# Patient Record
Sex: Female | Born: 1955 | Race: White | Hispanic: No | Smoking: Former smoker
Health system: Southern US, Community
[De-identification: ages and names within clinical notes are randomized; demographics above are authoritative.]

## PROBLEM LIST (undated history)

## (undated) DIAGNOSIS — G8929 Other chronic pain: Secondary | ICD-10-CM

## (undated) DIAGNOSIS — M545 Low back pain, unspecified: Secondary | ICD-10-CM

## (undated) DIAGNOSIS — R7989 Other specified abnormal findings of blood chemistry: Secondary | ICD-10-CM

## (undated) DIAGNOSIS — R945 Abnormal results of liver function studies: Secondary | ICD-10-CM

## (undated) DIAGNOSIS — K219 Gastro-esophageal reflux disease without esophagitis: Secondary | ICD-10-CM

## (undated) DIAGNOSIS — J069 Acute upper respiratory infection, unspecified: Secondary | ICD-10-CM

## (undated) DIAGNOSIS — H9319 Tinnitus, unspecified ear: Secondary | ICD-10-CM

## (undated) DIAGNOSIS — E539 Vitamin B deficiency, unspecified: Secondary | ICD-10-CM

## (undated) DIAGNOSIS — I10 Essential (primary) hypertension: Secondary | ICD-10-CM

## (undated) DIAGNOSIS — D497 Neoplasm of unspecified behavior of endocrine glands and other parts of nervous system: Secondary | ICD-10-CM

## (undated) DIAGNOSIS — J449 Chronic obstructive pulmonary disease, unspecified: Secondary | ICD-10-CM

## (undated) DIAGNOSIS — H9190 Unspecified hearing loss, unspecified ear: Secondary | ICD-10-CM

## (undated) DIAGNOSIS — E049 Nontoxic goiter, unspecified: Secondary | ICD-10-CM

## (undated) HISTORY — DX: Vitamin B deficiency, unspecified: E53.9

## (undated) HISTORY — DX: Acute upper respiratory infection, unspecified: J06.9

## (undated) HISTORY — DX: Low back pain, unspecified: M54.50

## (undated) HISTORY — PX: BACK SURGERY: SHX140

## (undated) HISTORY — DX: Other specified abnormal findings of blood chemistry: R79.89

## (undated) HISTORY — DX: Neoplasm of unspecified behavior of endocrine glands and other parts of nervous system: D49.7

## (undated) HISTORY — DX: Nontoxic goiter, unspecified: E04.9

## (undated) HISTORY — DX: Tinnitus, unspecified ear: H93.19

## (undated) HISTORY — DX: Low back pain: M54.5

## (undated) HISTORY — DX: Gastro-esophageal reflux disease without esophagitis: K21.9

## (undated) HISTORY — PX: WISDOM TOOTH EXTRACTION: SHX21

## (undated) HISTORY — DX: Abnormal results of liver function studies: R94.5

## (undated) HISTORY — DX: Unspecified hearing loss, unspecified ear: H91.90

## (undated) HISTORY — DX: Other chronic pain: G89.29

---

## 1988-12-11 HISTORY — PX: DILATION AND CURETTAGE OF UTERUS: SHX78

## 1999-03-01 ENCOUNTER — Encounter: Payer: Self-pay | Admitting: Internal Medicine

## 1999-03-01 ENCOUNTER — Ambulatory Visit (HOSPITAL_COMMUNITY): Admission: RE | Admit: 1999-03-01 | Discharge: 1999-03-01 | Payer: Self-pay | Admitting: Internal Medicine

## 1999-05-04 ENCOUNTER — Other Ambulatory Visit: Admission: RE | Admit: 1999-05-04 | Discharge: 1999-05-04 | Payer: Self-pay | Admitting: *Deleted

## 1999-11-15 ENCOUNTER — Ambulatory Visit (HOSPITAL_COMMUNITY): Admission: RE | Admit: 1999-11-15 | Discharge: 1999-11-15 | Payer: Self-pay | Admitting: *Deleted

## 1999-11-15 ENCOUNTER — Encounter: Payer: Self-pay | Admitting: *Deleted

## 2000-05-01 ENCOUNTER — Encounter: Payer: Self-pay | Admitting: Internal Medicine

## 2000-05-01 ENCOUNTER — Encounter: Admission: RE | Admit: 2000-05-01 | Discharge: 2000-05-01 | Payer: Self-pay | Admitting: Internal Medicine

## 2000-05-09 ENCOUNTER — Other Ambulatory Visit: Admission: RE | Admit: 2000-05-09 | Discharge: 2000-05-09 | Payer: Self-pay | Admitting: *Deleted

## 2001-08-15 ENCOUNTER — Other Ambulatory Visit: Admission: RE | Admit: 2001-08-15 | Discharge: 2001-08-15 | Payer: Self-pay | Admitting: *Deleted

## 2002-08-13 ENCOUNTER — Other Ambulatory Visit: Admission: RE | Admit: 2002-08-13 | Discharge: 2002-08-13 | Payer: Self-pay | Admitting: *Deleted

## 2003-09-25 ENCOUNTER — Other Ambulatory Visit: Admission: RE | Admit: 2003-09-25 | Discharge: 2003-09-25 | Payer: Self-pay | Admitting: *Deleted

## 2005-01-16 ENCOUNTER — Other Ambulatory Visit: Admission: RE | Admit: 2005-01-16 | Discharge: 2005-01-16 | Payer: Self-pay | Admitting: Obstetrics and Gynecology

## 2005-01-20 ENCOUNTER — Ambulatory Visit (HOSPITAL_COMMUNITY): Admission: RE | Admit: 2005-01-20 | Discharge: 2005-01-20 | Payer: Self-pay | Admitting: Obstetrics and Gynecology

## 2005-03-07 ENCOUNTER — Other Ambulatory Visit: Admission: RE | Admit: 2005-03-07 | Discharge: 2005-03-07 | Payer: Self-pay | Admitting: Interventional Radiology

## 2005-03-07 ENCOUNTER — Encounter (INDEPENDENT_AMBULATORY_CARE_PROVIDER_SITE_OTHER): Payer: Self-pay | Admitting: *Deleted

## 2005-03-07 ENCOUNTER — Encounter: Admission: RE | Admit: 2005-03-07 | Discharge: 2005-03-07 | Payer: Self-pay | Admitting: Obstetrics and Gynecology

## 2005-03-22 ENCOUNTER — Emergency Department (HOSPITAL_COMMUNITY): Admission: EM | Admit: 2005-03-22 | Discharge: 2005-03-22 | Payer: Self-pay | Admitting: Emergency Medicine

## 2005-04-15 ENCOUNTER — Ambulatory Visit (HOSPITAL_COMMUNITY): Admission: RE | Admit: 2005-04-15 | Discharge: 2005-04-15 | Payer: Self-pay | Admitting: Internal Medicine

## 2005-06-15 ENCOUNTER — Ambulatory Visit (HOSPITAL_COMMUNITY): Admission: RE | Admit: 2005-06-15 | Discharge: 2005-06-16 | Payer: Self-pay | Admitting: Neurosurgery

## 2005-08-10 ENCOUNTER — Encounter: Admission: RE | Admit: 2005-08-10 | Discharge: 2005-10-11 | Payer: Self-pay | Admitting: Neurosurgery

## 2006-03-19 ENCOUNTER — Encounter: Admission: RE | Admit: 2006-03-19 | Discharge: 2006-03-19 | Payer: Self-pay | Admitting: Internal Medicine

## 2007-01-30 ENCOUNTER — Ambulatory Visit: Payer: Self-pay | Admitting: Cardiology

## 2007-02-25 ENCOUNTER — Encounter: Payer: Self-pay | Admitting: Cardiology

## 2007-02-25 ENCOUNTER — Ambulatory Visit: Payer: Self-pay

## 2007-02-25 ENCOUNTER — Ambulatory Visit: Payer: Self-pay | Admitting: Cardiology

## 2007-02-25 LAB — CONVERTED CEMR LAB
BUN: 22 mg/dL (ref 6–23)
Bilirubin, Direct: 0.1 mg/dL (ref 0.0–0.3)
Chloride: 106 meq/L (ref 96–112)
Creatinine, Ser: 0.4 mg/dL (ref 0.4–1.2)
GFR calc non Af Amer: 179 mL/min
LDL Cholesterol: 78 mg/dL (ref 0–99)
Sodium: 140 meq/L (ref 135–145)
TSH: 1.2 microintl units/mL (ref 0.35–5.50)
Total Bilirubin: 0.9 mg/dL (ref 0.3–1.2)
Total CHOL/HDL Ratio: 3.1
Triglycerides: 194 mg/dL — ABNORMAL HIGH (ref 0–149)
VLDL: 39 mg/dL (ref 0–40)

## 2007-02-28 ENCOUNTER — Ambulatory Visit: Payer: Self-pay | Admitting: Cardiology

## 2007-03-08 ENCOUNTER — Ambulatory Visit: Payer: Self-pay | Admitting: Cardiology

## 2007-03-08 LAB — CONVERTED CEMR LAB
BUN: 17 mg/dL (ref 6–23)
CO2: 31 meq/L (ref 19–32)
Calcium: 9.4 mg/dL (ref 8.4–10.5)
Glucose, Bld: 89 mg/dL (ref 70–99)
Sodium: 142 meq/L (ref 135–145)

## 2007-03-20 ENCOUNTER — Ambulatory Visit: Payer: Self-pay | Admitting: Cardiology

## 2007-04-30 ENCOUNTER — Ambulatory Visit: Payer: Self-pay | Admitting: Internal Medicine

## 2007-04-30 LAB — CONVERTED CEMR LAB
ANA Titer 1: 1:80 {titer} — ABNORMAL HIGH
Anti Nuclear Antibody(ANA): POSITIVE — AB
Hepatitis B Surface Ag: NEGATIVE
Iron: 128 ug/dL (ref 42–145)
Transferrin: 325.2 mg/dL (ref >=212.0)

## 2007-05-02 ENCOUNTER — Ambulatory Visit: Payer: Self-pay | Admitting: Internal Medicine

## 2007-05-29 ENCOUNTER — Ambulatory Visit: Payer: Self-pay | Admitting: Internal Medicine

## 2007-07-09 ENCOUNTER — Encounter: Admission: RE | Admit: 2007-07-09 | Discharge: 2007-10-07 | Payer: Self-pay | Admitting: Internal Medicine

## 2007-12-03 ENCOUNTER — Ambulatory Visit: Payer: Self-pay | Admitting: Internal Medicine

## 2007-12-17 ENCOUNTER — Ambulatory Visit: Payer: Self-pay | Admitting: Internal Medicine

## 2008-03-11 ENCOUNTER — Ambulatory Visit (HOSPITAL_COMMUNITY): Admission: RE | Admit: 2008-03-11 | Discharge: 2008-03-11 | Payer: Self-pay | Admitting: *Deleted

## 2008-04-11 DIAGNOSIS — K7689 Other specified diseases of liver: Secondary | ICD-10-CM | POA: Insufficient documentation

## 2008-04-11 DIAGNOSIS — K219 Gastro-esophageal reflux disease without esophagitis: Secondary | ICD-10-CM | POA: Insufficient documentation

## 2008-04-11 DIAGNOSIS — J309 Allergic rhinitis, unspecified: Secondary | ICD-10-CM | POA: Insufficient documentation

## 2008-04-11 DIAGNOSIS — E039 Hypothyroidism, unspecified: Secondary | ICD-10-CM | POA: Insufficient documentation

## 2008-04-11 DIAGNOSIS — M129 Arthropathy, unspecified: Secondary | ICD-10-CM | POA: Insufficient documentation

## 2008-04-11 DIAGNOSIS — I10 Essential (primary) hypertension: Secondary | ICD-10-CM | POA: Insufficient documentation

## 2008-04-11 DIAGNOSIS — F341 Dysthymic disorder: Secondary | ICD-10-CM | POA: Insufficient documentation

## 2009-06-01 ENCOUNTER — Encounter: Payer: Self-pay | Admitting: Internal Medicine

## 2009-07-05 ENCOUNTER — Encounter: Admission: RE | Admit: 2009-07-05 | Discharge: 2009-07-05 | Payer: Self-pay | Admitting: Otolaryngology

## 2009-09-27 ENCOUNTER — Encounter: Payer: Self-pay | Admitting: Internal Medicine

## 2009-12-24 ENCOUNTER — Encounter: Payer: Self-pay | Admitting: Internal Medicine

## 2009-12-29 ENCOUNTER — Encounter: Admission: RE | Admit: 2009-12-29 | Discharge: 2009-12-29 | Payer: Self-pay | Admitting: Internal Medicine

## 2010-06-22 ENCOUNTER — Encounter: Payer: Self-pay | Admitting: Internal Medicine

## 2010-06-30 ENCOUNTER — Encounter (INDEPENDENT_AMBULATORY_CARE_PROVIDER_SITE_OTHER): Payer: Self-pay | Admitting: *Deleted

## 2010-08-18 ENCOUNTER — Encounter: Payer: Self-pay | Admitting: Internal Medicine

## 2010-08-22 ENCOUNTER — Ambulatory Visit: Payer: Self-pay | Admitting: Internal Medicine

## 2010-10-21 ENCOUNTER — Encounter: Admission: RE | Admit: 2010-10-21 | Discharge: 2010-10-21 | Payer: Self-pay | Admitting: Gastroenterology

## 2010-12-20 ENCOUNTER — Encounter
Admission: RE | Admit: 2010-12-20 | Discharge: 2010-12-20 | Payer: Self-pay | Source: Home / Self Care | Attending: Internal Medicine | Admitting: Internal Medicine

## 2011-01-01 ENCOUNTER — Encounter: Payer: Self-pay | Admitting: *Deleted

## 2011-01-12 NOTE — Miscellaneous (Signed)
Summary: medication list  Clinical Lists Changes  Medications: Added new medication of GLUCOSAMINE 500 MG CAPS (GLUCOSAMINE SULFATE) 2 by mouth once daily Added new medication of MULTIVITAMINS  TABS (MULTIPLE VITAMIN) 1 by mouth once daily Added new medication of ZYRTEC ALLERGY 10 MG CAPS (CETIRIZINE HCL) take 1 by mouth once daily as needed allergy Added new medication of CALCIUM 500 MG TABS (CALCIUM) 1 by mouth two times a day Added new medication of TRAMADOL HCL 50 MG TABS (TRAMADOL HCL) 1-2 by mouth three times a day as needed pain Added new medication of VITAMIN B-12 1000 MCG SUBL (CYANOCOBALAMIN) 1 under tongue once daily Added new medication of VITAMIN D3 1000 UNIT CAPS (CHOLECALCIFEROL) 2 caps by mouth once daily Added new medication of LOSARTAN POTASSIUM 50 MG TABS (LOSARTAN POTASSIUM) 1 by mouth once daily

## 2011-01-12 NOTE — Letter (Signed)
Summary: New Patient letter  [facility]  [address] [place], [place] [address]   Phone: [phone]  Fax: [phone]       06/30/2010 MRN: [id number]  [facility] [name] [name] [address] [place], [place]  [address]  Dear [name] [name],  Welcome to the Gastroenterology Division at [facility].    You are scheduled to see Dr. Marina Goodell on 08/22/2010 at 10:00AM on the 3rd floor at [facility], [address]  We ask that you try to arrive at our office 15 minutes prior to your appointment time to allow for check-in.  We would like you to complete the enclosed self-administered evaluation form prior to your visit and bring it with you on the day of your appointment.  We will review it with you.  Also, please bring a complete list of all your medications or, if you prefer, bring the medication bottles and we will list them.  Please bring your insurance card so that we may make a copy of it.  If your insurance requires a referral to see a specialist, please bring your referral form from your primary care physician.  Co-payments are due at the time of your visit and may be paid by cash, check or credit card.     Your office visit will consist of a consult with your physician (includes a physical exam), any laboratory testing he/she may order, scheduling of any necessary diagnostic testing (e.g. x-ray, ultrasound, CT-scan), and scheduling of a procedure (e.g. Endoscopy, Colonoscopy) if required.  Please allow enough time on your schedule to allow for any/all of these possibilities.    If you cannot keep your appointment, please call [phone] to cancel or reschedule prior to your appointment date.  This allows [place] the opportunity to schedule an appointment for another patient in need of care.  If you do not cancel or reschedule by 5 p.m. the business day prior to your appointment date, you will be charged a $50.00 late cancellation/no-show fee.    Thank you for  choosing [facility] for your medical needs.  We appreciate the opportunity to care for you.  Please visit [place] at our website  to learn more about our practice.                     Sincerely,                                                             The Gastroenterology Division

## 2011-01-12 NOTE — Letter (Signed)
Summary: [facility]   Imported By: Sherian Rein 12/26/2010 12:02:28  _____________________________________________________________________  External Attachment:    Type:   Image     Comment:   External Document

## 2011-01-12 NOTE — Assessment & Plan Note (Signed)
Summary: elevated liver enzymes...as.    History of Present Illness Visit Type: Initial Consult Primary GI MD: Yancey Flemings MD Primary Provider: Merri Brunette, MD Requesting Provider: Merri Brunette, MD Chief Complaint: abnormal liver function tests History of Present Illness:   55 year old female with a history of GERD and chronic elevation of hepatic transaminases previously evaluated in 2008 and felt likely due to nonalcoholic fatty liver disease. She is sent back today regarding abnormal liver tests.  Upon entering the examination room, the patient was upset and visibly angry. She told me that she had contacted the office this morning an effort to cancel her appointment. When she was advised of the cancellation policy and the potential to be charged for last minute cancellation, she decided to show for her appointment. She arrived on time and her workup was initiated ON TIME. After the nursing workup was completed and reviewed, and her previous records from prior visits,as well as interval outside records were all reviewed, and I entered the room (iit was  now approximately 25 minutes past her scheduled appointment time). She angerly stated that she was going to charge this office. I attempted to explain the office policy, as well as the time I spent preparing for her visit, prior to and during the room. This did not satisfy her in the least.Given her demeanor and tone, I asked her if she would prefer seeing another physician at another practice. She did, and requested her co-pay be returned - which it was...  Her clinical problem is important and needs evaluated relatively soon. I left this information/message with her primary care physician, Dr. Renne Crigler, via his voicemail as well as one of his nurses.   GI Review of Systems    Reports heartburn.      Denies abdominal pain, acid reflux, belching, bloating, chest pain, dysphagia with liquids, dysphagia with solids, loss of appetite, nausea,  vomiting, vomiting blood, weight loss, and  weight gain.      Reports change in bowel habits, constipation, irritable bowel syndrome, and  liver problems.     Denies anal fissure, black tarry stools, diarrhea, diverticulosis, fecal incontinence, heme positive stool, hemorrhoids, jaundice, light color stool, rectal bleeding, and  rectal pain. Preventive Screening-Counseling & Management  Alcohol-Tobacco     Smoking Status: quit      Drug Use:  no.      Current Medications (verified): 1)  Glucosamine 500 Mg Caps (Glucosamine Sulfate) .... 2 By Mouth Once Daily 2)  Multivitamins  Tabs (Multiple Vitamin) .Marland Kitchen.. 1 By Mouth Once Daily 3)  Zyrtec Allergy 10 Mg Caps (Cetirizine Hcl) .... Take 1 By Mouth Once Daily As Needed Allergy 4)  Calcium 500 Mg Tabs (Calcium) .Marland Kitchen.. 1 By Mouth Two Times A Day 5)  Tramadol Hcl 50 Mg Tabs (Tramadol Hcl) .Marland Kitchen.. 1-2 By Mouth Three Times A Day As Needed Pain 6)  Vitamin B-12 1000 Mcg Subl (Cyanocobalamin) .Marland Kitchen.. 1 Under Tongue Once Daily 7)  Vitamin D3 1000 Unit Caps (Cholecalciferol) .Marland Kitchen.. 1 Caps By Mouth Once Daily 8)  Losartan Potassium 50 Mg Tabs (Losartan Potassium) .Marland Kitchen.. 1 By Mouth Once Daily  Allergies (verified): No Known Drug Allergies  Past History:  Past Medical History: Reviewed history from 04/11/2008 and no changes required. Current Problems:  GERD (ICD-530.81) FATTY LIVER DISEASE (ICD-571.8) ALLERGIC RHINITIS (ICD-477.9) ANXIETY DEPRESSION (ICD-300.4) ARTHRITIS (ICD-716.90) HYPOTHYROIDISM (ICD-244.9) HYPERTENSION (ICD-401.9)  Past Surgical History: Back Surgery  Family History: No FH of Colon Cancer: Family History of Diabetes:  Family History of Heart  Disease:   Social History: Occupation: Acupuncturist Caffeine Use Illicit Drug Use - no Alcohol Use - no Smoking Status:  quit Drug Use:  no  Review of Systems       The patient complains of allergy/sinus, anemia, arthritis/joint pain, back pain, fatigue, hearing problems,  and night sweats.  The patient denies anxiety-new, blood in urine, breast changes/lumps, change in vision, confusion, cough, coughing up blood, depression-new, fainting, fever, headaches-new, heart murmur, heart rhythm changes, itching, menstrual pain, muscle pains/cramps, nosebleeds, pregnancy symptoms, shortness of breath, skin rash, sleeping problems, sore throat, swelling of feet/legs, swollen lymph glands, thirst - excessive, urination - excessive, urination changes/pain, urine leakage, vision changes, and voice change.    Vital Signs:  Patient profile:   55 year old female Height:      67 inches Weight:      164 pounds BMI:     25.78 Pulse rate:   76 / minute Pulse rhythm:   regular BP sitting:   120 / 80  (left arm)  Vitals Entered By: June McMurray CMA Duncan Dull) (August 22, 2010 9:59 AM)   Patient Instructions: 1)  COPY: Dr. Merri Brunette

## 2011-01-12 NOTE — Letter (Signed)
Summary: Medication List/[facility]  Medication List/[facility]   Imported By: Sherian Rein 12/26/2010 12:05:22  _____________________________________________________________________  External Attachment:    Type:   Image     Comment:   External Document

## 2011-01-12 NOTE — Letter (Signed)
Summary: [facility]   Imported By: Sherian Rein 12/26/2010 12:03:32  _____________________________________________________________________  External Attachment:    Type:   Image     Comment:   External Document

## 2011-01-16 ENCOUNTER — Encounter (HOSPITAL_COMMUNITY): Payer: Self-pay | Admitting: Radiology

## 2011-01-16 ENCOUNTER — Emergency Department (HOSPITAL_COMMUNITY): Payer: 59

## 2011-01-16 ENCOUNTER — Emergency Department (HOSPITAL_COMMUNITY)
Admission: EM | Admit: 2011-01-16 | Discharge: 2011-01-16 | Disposition: A | Discharge status: Home / Self Care | Payer: 59 | Attending: Emergency Medicine | Admitting: Emergency Medicine

## 2011-01-16 DIAGNOSIS — M79609 Pain in unspecified limb: Secondary | ICD-10-CM | POA: Insufficient documentation

## 2011-01-16 DIAGNOSIS — J189 Pneumonia, unspecified organism: Secondary | ICD-10-CM | POA: Insufficient documentation

## 2011-01-16 DIAGNOSIS — R0789 Other chest pain: Secondary | ICD-10-CM | POA: Insufficient documentation

## 2011-01-16 DIAGNOSIS — I10 Essential (primary) hypertension: Secondary | ICD-10-CM | POA: Insufficient documentation

## 2011-01-16 HISTORY — DX: Essential (primary) hypertension: I10

## 2011-01-16 LAB — TROPONIN I: Troponin I: 0.02 ng/mL (ref 0.00–0.06)

## 2011-01-16 LAB — URINALYSIS, ROUTINE W REFLEX MICROSCOPIC
Bilirubin Urine: NEGATIVE
Protein, ur: NEGATIVE mg/dL
Specific Gravity, Urine: 1.014 (ref 1.005–1.030)
Urine Glucose, Fasting: NEGATIVE mg/dL
pH: 6 (ref 5.0–8.0)

## 2011-01-16 LAB — PROTIME-INR: INR: 0.87 (ref 0.00–1.49)

## 2011-01-16 LAB — DIFFERENTIAL
Basophils Absolute: 0 10*3/uL (ref 0.0–0.1)
Neutro Abs: 5.3 10*3/uL (ref 1.7–7.7)
Neutrophils Relative %: 77 % (ref 43–77)

## 2011-01-16 LAB — POCT CARDIAC MARKERS
CKMB, poc: 3.5 ng/mL (ref 1.0–8.0)
Myoglobin, poc: 199 ng/mL (ref 12–200)

## 2011-01-16 LAB — CBC
MCH: 31.4 pg (ref 26.0–34.0)
MCHC: 35.2 g/dL (ref 30.0–36.0)
MCV: 89.3 fL (ref 78.0–100.0)
Platelets: 111 10*3/uL — ABNORMAL LOW (ref 150–400)
RBC: 4.87 MIL/uL (ref 3.87–5.11)
RDW: 14 % (ref 11.5–15.5)

## 2011-01-16 LAB — BASIC METABOLIC PANEL
Creatinine, Ser: 0.44 mg/dL (ref 0.4–1.2)
GFR calc Af Amer: >60 mL/min (ref >60)

## 2011-01-16 MED ORDER — IOHEXOL 300 MG/ML  SOLN: 100.0000 mL | Freq: Once | INTRAMUSCULAR | Status: DC | PRN

## 2011-01-17 LAB — URINE CULTURE
Colony Count: NO GROWTH
Culture  Setup Time: 201202061822

## 2011-01-25 ENCOUNTER — Other Ambulatory Visit: Payer: Self-pay | Admitting: Gastroenterology

## 2011-04-13 ENCOUNTER — Other Ambulatory Visit (HOSPITAL_COMMUNITY): Payer: Self-pay | Admitting: Internal Medicine

## 2011-04-13 DIAGNOSIS — R911 Solitary pulmonary nodule: Secondary | ICD-10-CM

## 2011-04-19 ENCOUNTER — Other Ambulatory Visit (HOSPITAL_COMMUNITY): Payer: Self-pay | Admitting: *Deleted

## 2011-04-19 ENCOUNTER — Other Ambulatory Visit (HOSPITAL_COMMUNITY): Payer: 59

## 2011-04-25 NOTE — Assessment & Plan Note (Signed)
[facility]                         GASTROENTEROLOGY OFFICE NOTE   NAME:PHILLIPPIEDajia, Gunnels                     MRN:          [id number]  DATE:05/29/2007                            DOB:          09/25/1956    HISTORY:  This is a 55 year old female with a history of hypertension,  osteoarthritis, and anxiety with depression who was evaluated Apr 30, 2007 for elevated hepatic transaminases.  See that dictation for  details.  Clinically, she was felt to have nonalcoholic fatty liver  disease.  However, given the chronic nature of her enzyme abnormalities,  a thorough workup was pursued.  Viral studies as well as metabolic and  autoimmune studies were all normal.  Her prothrombin time and iron  studies were normal.  An abdominal ultrasound revealed changes in the  liver consistent with fatty liver.  She was also noted to have  cholelithiasis, which was incidental.  Patient also has chronic reflux  disease, and mentioned some minor rectal bleeding.  We discussed  surveillance colonoscopy as well as surveillance upper endoscopy.  Since  her last visit, she has been clinically stable.  I have reviewed these  findings with her.   CURRENT MEDICATIONS:  Wellbutrin, multivitamin, calcium, glucosamine,  Aleve, Micardis, and Zyrtec.   PHYSICAL EXAMINATION:  Limited physical exam finds a well-appearing  female in no acute distress.  Blood pressure is 110/82.  Heart rate is 64.  Weight is 186 pounds.  ABDOMEN:  Was not reexamined.   IMPRESSION:  1. Chronic mild elevation of hepatic transaminases, almost certainly      secondary to nonalcoholic fatty liver disease.  I discussed with      her today benign fatty liver disease as well as nonalcoholic      steatohepatitis, and the clinical implications.  We discussed the      role of liver biopsy in distinguishing between the two entities.      However, at this point, biopsy with it's inherent risks would not  alter clinical management. Thus, we have decided to forego such.      The recommendation is for gradual  and sustained weight loss.  2. Chronic gastroesophageal reflux disease.  Rule out Barrett's      esophagus.  3. Minor rectal bleeding.  Likely due to benign anorectal pathology.  4. Colon cancer screening.  Baseline risk.   RECOMMENDATIONS:  1. Weight loss as discussed above.  2. Screening colonoscopy and upper endoscopy.  The nature of the      procedure as well as the risks, benefits, and alternatives were      reviewed in great detail.  She understood and was interested in      proceeding.  However, she wished to contact the office at a later      date to      schedule the examinations.  She was given our telephone number and      contact person.  In the interim, she will resume her general      medical care with Dr. Renne Crigler.     Wilhemina Bonito. Marina Goodell, MD  Electronically Signed  JNP/MedQ  DD: 05/29/2007  DT: 05/30/2007  Job #: [id number]   cc:   Thomas C. Daleen Squibb, MD, [facility]  Soyla Murphy. Renne Crigler, M.D.  Juluis Mire, M.D.

## 2011-04-25 NOTE — Assessment & Plan Note (Signed)
[facility]                         GASTROENTEROLOGY OFFICE NOTE   NAME:[name], [name] NIEVES                     MRN:          [id number]  DATE:04/30/2007                            DOB:          1956-07-02    REFERRING PHYSICIAN:  Jesse Sans. Wall, MD, FACC   REASON FOR CONSULTATION:  Elevated liver tests.   HISTORY:  This is a 55 year old white female with a history of  hypertension, osteoarthritis and anxiety with depression, who was  referred through the courtesy of Dr. Maisie Fus C. [name] regarding elevated  liver enzymes.  The patient sees Dr. Daleen [name] for cardiac risk reduction.  The hepatic panel performed on February 25, 2007, revealed mildly elevated  hepatic transaminases with an SGOT of 89, SGPT 112, alkaline phosphatase  normal at 110, as was total bilirubin at 0.9.  Total protein and albumin  were also normal, as was her globulin level.  Looking back at the  patient's chart, her enzymes were elevated in a similar manner in March  2008.  She also had an elevated SGPT back in 1996.  The patient denies a  personal history of hepatitis.  She denies that she has received blood  transfusions or been in contact with persons noted to have hepatitis.  She does not use alcohol to any significant degree.  Though her weight  has been stable over the past five years, she tells me that she has  probably gained up to 70 pounds since the birth of her 65 year old son.  The patient has other GI issues.  First, she reports chronic problems  with indigestion and heartburn.  For this she has been on Prilosec with  good control of the symptoms.  Off of Prilosec, she has significant  breakthrough, to the point where she wakes up at night and needs to use  Maalox.  No dysphagia.  Next, she does note occasional blood per rectum  after a hard bowel movement.  She attributes this to hemorrhoids.  It  has been recommended to her by other physicians that she have a  screening  colonoscopy.  She is willing.   PAST MEDICAL HISTORY:  1. Hypertension.  2. Osteoarthritis.  3. Anxiety and depression.  4. Unspecified thyroid disorder.   PAST SURGICAL HISTORY:  None.   ALLERGIES:  No known drug allergies.   CURRENT MEDICATIONS:  1. Multivitamin.  2. Calcium with vitamin D.  3. Glucosamine/chondroitin.  4. Aleve up to four daily.  5. Micardis 40 mg daily.   FAMILY HISTORY:  Mother had multiple medical problems including lupus.  The patient thinks her mother may have had some liver problems, though  cannot specify.  No family history of luminal GI malignancy.   SOCIAL HISTORY:  The patient is married with one son.  Lives with her  husband and child.  She does not smoke and rarely has an alcoholic  beverage at the holidays or special occasions.  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   REVIEW OF SYSTEMS:  Per the diagnostic evaluation form.   PHYSICAL EXAMINATION:  GENERAL:  A well-appearing female, in  no acute  distress.  VITAL SIGNS:  Blood pressure 112/80, heart rate 72, weight 187.8 pounds.  She is 5 feet 7 inches in height.  HEENT:  Sclerae anicteric.  Conjunctivae pink.  Oral mucosa intact.  NODES:  No adenopathy.  LUNGS:  Clear.  HEART:  Regular.  ABDOMEN:  Soft without tenderness, mass or herniae.  No ascites, no  organomegaly.  EXTREMITIES:  Without edema.   IMPRESSION:  1. Chronic mild elevation of hepatic transaminases.  Statistically the      most likely cause is non-alcoholic fatty liver disease.  Other      etiologies should be excluded.  2. Chronic gastroesophageal reflux disease.  Significant symptoms of      proton pump inhibitor therapy.  3. Minor rectal bleeding, likely due to benign anorectal causes.  4. Colon cancer screening.  An appropriate candidate without      contraindication.   RECOMMENDATIONS:  1. Expand laboratory profile to include chronic viral and chronic non-      viral studies.  2. Hepatic  ultrasound.  3. Office followup in three to four weeks, to review the above.  4. Prilosec daily.  5. Reflux precautions, with attention to weight loss.  6. At the time of her followup, plan to schedule a screening      colonoscopy and screening upper endoscopy.     [name] [name]. [name] Goodell, MD  Electronically Signed    [name] [name]  DD: 04/30/2007  DT: 04/30/2007  Job #: [id number]   cc:   [name] [name]. [name] [name], M.D.  [name] C. [name] Squibb, MD, [name] [name]  [name] [name], M.D.

## 2011-04-28 NOTE — Assessment & Plan Note (Signed)
[facility]                            CARDIOLOGY OFFICE NOTE   NAME:PHILLIPPIEGeorganna, [name]                     MRN:          [id number]  DATE:01/30/2007                            DOB:          1956-08-11    HISTORY OF PRESENT ILLNESS:  [name] is a pleasant 55-year-  old married white female, originally from [place]. She has lived in the  area for twenty something years. She comes today from Dr. Richardean Chimera,  because of uncontrolled hypertension.   She admits to not seeing her primary care physician on a regular basis.   She has had hypertension for at least 20 years. She has been on Atenolol  for about 20 years.   She has a strong family history of coronary disease with her father  having an MI at age 37. Her mother also had an MI at age 63, but had  other medical problems including lupus, tobacco use, and diabetes.   Her brother died young, actually in his 30s, from sudden cardiac death.  He was found to have right ventricular dysplasia.   She does not exercise on a regular basis. When she does exercise, she  denies any chest tightness, pressure, heaviness, or angina. She does not  have any history of tachy palpitations, no pre-syncope or syncope.   She does not know her lipid status.   PAST MEDICAL HISTORY:   ALLERGIES:  She has no known drug allergies.   MEDICATIONS:  1. Atenolol 50 mg daily.  2. Wellbutrin 300 mg daily.  3. Multivitamin.  4. Calcium.  5. Vitamin D.  6. Glucosamine.  7. Chondroitin.  8. Alleve 4 daily.   She does not smoke. She did smoke at one time and quit 10 years ago. She  does not use any recreational drugs. She does drink a couple of drinks  of alcohol per month. She drinks 2 cups of caffeinated beverages a day.  She does not give a history of high cholesterol.   PAST SURGICAL HISTORY:  She has had back surgery for a disc in 2006.   She has a history of depression and is on Wellbutrin.   SOCIAL HISTORY:  She is married and has one child. 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   REVIEW OF SYSTEMS:  Other than the history of present illness is really  negative. All systems were tested and questioned.   PHYSICAL EXAMINATION:  GENERAL: She is very pleasant. She is in no acute  distress.  SKIN: Warm and dry. Grossly intact.  VITAL SIGNS: Blood pressure 156/80 in the left arm, pulse 61 and  regular. EKG is normal. Weight 184. Height 5 feet 7 inches.  HEENT: Normocephalic atraumatic. PERRLA. Extraocular movements intact.  Sclerae clear. Facial symmetry is normal. Carotid upstrokes are equal  bilaterally without bruits. There is no JVD. Thyroid is not enlarged.  Trachea is midline.  LUNGS: Clear.  HEART: Regular rate and rhythm and non-displaced PMI. There is no right  ventricular lift. S2 splits physiologically.  ABDOMEN: Good bowel sounds, no midline bruits. There is no hepatomegaly.  EXTREMITIES: No  cyanosis, clubbing, or edema. Pulses are brisk.  NEUROLOGIC: Grossly intact.  MUSCULOSKELETAL: Intact.   ASSESSMENT:  1. Uncontrolled hypertension.  2. Family history of coronary artery disease, though not really      premature, except in her mother who had other risk factors.  3. Moderate obesity.  4. Sedentary lifestyle.  5. Unknown lipid status.  6. Remote tobacco.  7. Family history of right ventricular dysplasia in her brother.   PLAN:  1. Fasting lipids and comprehensive metabolic panel. We will also      check a TSH.  2. 2D echocardiogram to evaluate LV and RV function, and to rule out      any suspicion of RV dysplasia.  3. Follow up visit to discuss lab values and change in her      antihypertensive regimen and/or the addition of a hyperlipidemic      agent.     Thomas C. Daleen Squibb, MD, [facility]  Electronically Signed   TCW/MedQ  DD: 01/30/2007  DT: 01/31/2007  Job #: [id number]   cc:   Juluis Mire, M.D.  Soyla Murphy. Renne Crigler, M.D.

## 2011-04-28 NOTE — Assessment & Plan Note (Signed)
[facility]                            CARDIOLOGY OFFICE NOTE   NAME:PHILLIPPIEDina, War[place]ton                     MRN:          [id number]  DATE:02/28/2007                            DOB:          01-14-1956    Mrs. Glick comes in today to discuss the findings of her 2D echo,  as well as her blood work.  She was referred to me for cardiac risk  factors and hypertension.   Her echocardiogram was completely normal.  There was no evidence of LVH.   Her blood work showed a normal BMET, hepatic profile that was abnormal  with an SGOT and SGPT of 89 and 112 respectively.  The rest of her liver  function was normal.  She was told that this was a problem a couple of  years ago, but did not get it evaluated.  She is asymptomatic.   Lipid profile showed a total cholesterol of 172, triglycerides of 194,  HDL 55.4, LDL of 78, TSH was normal.   I have asked Mrs. Gaines to stop her atenolol and to go on Micardis  40 mg a day.  We will get her back in 10 days to 2 weeks to check a CHEM-  7 and to follow up on her blood pressure.  We will specifically pay  attention to her potassium.  I will also refer her to Dr. Yancey Flemings of  GI for evaluation of abnormal transaminases.     Thomas C. Daleen Squibb, MD, [facility]  Electronically Signed    TCW/MedQ  DD: 02/28/2007  DT: 02/28/2007  Job #: [id number]   cc:   Wilhemina Bonito. Marina Goodell, MD  Juluis Mire, M.D.

## 2011-04-28 NOTE — Assessment & Plan Note (Signed)
[facility]                            CARDIOLOGY OFFICE NOTE   NAME:PHILLIPPIEAubryana, [name] [name]                     MRN:          [id number]  DATE:03/20/2007                            DOB:          09-27-1956    [name] returns today for further management of her hypertension.  We discontinued atenolol and placed her on Micardis. Her initial chem-7  showed an upper limits of normal potassium at 5.1. We repeated it and it  was 4.3. I have asked her to stay well hydrated and cut back on some of  her fruits.   She could stand to cut back on her carbohydrates as well with her high  triglycerides.   Her blood pressure today was 135/84. She had an auscultatory gap, which  I told her about and told her that she needs to make sure that she gets  absolute number for her diastolic read. She brought her cuff with her  which was measuring her pressures higher than ours. She is resigned to  get a new one.   Her heart rate is 77. Weight is 186. Respiratory examination is  unchanged.   EKG is normal.   She has had some tachy palpitations. They have been brief at night when  she lies down. I told her if these continue that we may need to put her  back on a low dose of atenolol. Otherwise, I will see her back in a year  to two years or p.r.n.   She promised to follow up with GI for abnormal LFTs. We made an  appointment for her with Dr. Marina Goodell.     Thomas C. Daleen Squibb, MD, [facility]  Electronically Signed    TCW/MedQ  DD: 03/20/2007  DT: 03/20/2007  Job #: [id number]   cc:   Juluis Mire, M.D.

## 2011-04-28 NOTE — Op Note (Signed)
[name] [name], DINAN NO.:  [id number]   MEDICAL RECORD NO.:  [id number]          PATIENT TYPE:  OIB   LOCATION:  2899                         FACILITY:  [facility]   PHYSICIAN:  Clydene Fake, M.D.  DATE OF BIRTH:  October 10, 1956   DATE OF PROCEDURE:  06/15/2005  DATE OF DISCHARGE:                                 OPERATIVE REPORT   PREOPERATIVE DIAGNOSIS:  Herniated nucleus pulposus, right L4-5.   POSTOPERATIVE DIAGNOSIS:  Herniated nucleus pulposus, right L4-5.   PROCEDURE:  Right L4-5 semi hemilaminectomy and diskectomy, microdissection  with microscope.   SURGEON:  Clydene Fake, M.D.   ASSISTANT:  Stefani Dama, M.D.   ANESTHESIA:  General endotracheal.   ESTIMATED BLOOD LOSS:  Minimal.   BLOOD REPLACED:  None.   DRAINS:  None.   COMPLICATIONS:  None.   INDICATIONS FOR PROCEDURE:  The patient is a 55 year old woman who has been  having right leg numbness and pain and also finds some weakness in the right  EHL and quadriceps.  She has had some slight improvement with  antiinflammatory __________ but still continued with numbness and weakness  in the EHL.  MRI showed a large broad-based disk protrusion at L4-5,  possibly worse to the left, but on the right side, there was an extruded  fragment going cephalad, compressing the 4 root and most likely compressing  the 5 root also.  The patient brought in for diskectomy.   DESCRIPTION OF PROCEDURE:  The patient was brought into the operating room.  General anesthesia was induced.  The patient was placed in the prone  position on the Wilson frame with all pressure points padded.  The patient  was prepped and draped in sterile fashion.  The site of incision was  injected with 10 cc of 1% lidocaine with epinephrine.  The needle was placed  in the interspace, and x-rays were obtained, insuring that we were pointing  at the L4-5 interspace.   An incision was then made centered over where the needle was on  the right  side and the incision taken down to the fascia.  Hemostasis was obtained  with Bovie cauterization.  The fascia was incised with the Bovie on the  right side, and subperiosteal dissection was done at the L4-5 spinous  process in the lamina down to the facet.  A self-retaining retractor was  placed.  A marker was placed in the interspace, and x-ray was obtained,  showing that this was the 4-5 interspace.   The microscope was brought in for microdissection at this point.  A high-  speed drill was used to start the semi hemilaminectomy.  This was completed  with Kerrison punches.  The ligamentum flavum was removed.  The lateral  recess was decompressed and a foraminotomy done over the 5 root.  We then  explored the disk space, and there was a large subligamentous protrusion of  disk.  We incised the disk space and performed diskectomy with pituitary  rongeurs and curettes.  There was a large subligamentous piece of disk  extending over the body of L4,  pushing up into the L4 root.  This was  removed and decompressed.  When we were finished, we had good decompression  of both the 4 and 5 roots.  Hemostasis was obtained with bipolar  cauterization, Gelfoam, and thrombin.  Gelfoam was irrigated out and  irrigated with antibiotic solution.  We again checked the nerve roots.  We  had good decompression of the central canal and the 4 and 5 roots.  Good  hemostasis.  The retractors were removed.  The fascia was closed with 0  Vicryl interrupted suture.  The subcutaneous tissue was closed with 0, 2-0,  and 3-0 vertical interrupted suture.  The skin was closed with benzoin and  Steri-Strips.  A dressing was placed.   The patient was placed back in the supine position, awakened from  anesthesia, and transferred to the recovery room in stable condition.       JRH/MEDQ  D:  06/15/2005  T:  06/15/2005  Job:  [id number]

## 2011-05-04 ENCOUNTER — Other Ambulatory Visit (HOSPITAL_COMMUNITY): Payer: 59

## 2011-05-26 ENCOUNTER — Inpatient Hospital Stay (HOSPITAL_COMMUNITY): Admission: RE | Admit: 2011-05-26 | Payer: 59 | Source: Ambulatory Visit

## 2012-03-06 ENCOUNTER — Other Ambulatory Visit (HOSPITAL_COMMUNITY): Payer: Self-pay | Admitting: Obstetrics and Gynecology

## 2012-03-06 DIAGNOSIS — E041 Nontoxic single thyroid nodule: Secondary | ICD-10-CM

## 2012-03-08 ENCOUNTER — Other Ambulatory Visit (HOSPITAL_COMMUNITY): Payer: 59

## 2012-03-12 ENCOUNTER — Ambulatory Visit (HOSPITAL_COMMUNITY)
Admission: RE | Admit: 2012-03-12 | Discharge: 2012-03-12 | Disposition: A | Discharge status: Home / Self Care | Payer: 59 | Source: Ambulatory Visit | Attending: Obstetrics and Gynecology | Admitting: Obstetrics and Gynecology

## 2012-03-12 DIAGNOSIS — E041 Nontoxic single thyroid nodule: Secondary | ICD-10-CM

## 2012-04-02 ENCOUNTER — Other Ambulatory Visit: Payer: Self-pay | Admitting: Obstetrics and Gynecology

## 2014-09-03 ENCOUNTER — Other Ambulatory Visit: Payer: Self-pay | Admitting: Internal Medicine

## 2014-09-03 DIAGNOSIS — R748 Abnormal levels of other serum enzymes: Secondary | ICD-10-CM

## 2014-09-11 ENCOUNTER — Ambulatory Visit
Admission: RE | Admit: 2014-09-11 | Discharge: 2014-09-11 | Disposition: A | Discharge status: Home / Self Care | Payer: BC Managed Care – PPO | Source: Ambulatory Visit | Attending: Internal Medicine | Admitting: Internal Medicine

## 2014-09-11 DIAGNOSIS — R748 Abnormal levels of other serum enzymes: Secondary | ICD-10-CM

## 2014-09-15 ENCOUNTER — Other Ambulatory Visit: Payer: Self-pay | Admitting: Gastroenterology

## 2014-09-15 DIAGNOSIS — N281 Cyst of kidney, acquired: Secondary | ICD-10-CM

## 2014-09-15 DIAGNOSIS — R740 Nonspecific elevation of levels of transaminase and lactic acid dehydrogenase [LDH]: Principal | ICD-10-CM

## 2014-09-15 DIAGNOSIS — R1013 Epigastric pain: Secondary | ICD-10-CM

## 2014-09-15 DIAGNOSIS — R7402 Elevation of levels of lactic acid dehydrogenase (LDH): Secondary | ICD-10-CM

## 2014-09-18 ENCOUNTER — Ambulatory Visit
Admission: RE | Admit: 2014-09-18 | Discharge: 2014-09-18 | Disposition: A | Discharge status: Home / Self Care | Payer: BC Managed Care – PPO | Source: Ambulatory Visit | Attending: Gastroenterology | Admitting: Gastroenterology

## 2014-09-18 DIAGNOSIS — R7402 Elevation of levels of lactic acid dehydrogenase (LDH): Secondary | ICD-10-CM

## 2014-09-18 DIAGNOSIS — N281 Cyst of kidney, acquired: Secondary | ICD-10-CM

## 2014-09-18 DIAGNOSIS — R740 Nonspecific elevation of levels of transaminase and lactic acid dehydrogenase [LDH]: Principal | ICD-10-CM

## 2014-09-18 DIAGNOSIS — R1013 Epigastric pain: Secondary | ICD-10-CM

## 2014-09-18 MED ORDER — IOHEXOL 300 MG/ML  SOLN
100.0000 mL | Freq: Once | INTRAMUSCULAR | Status: AC | PRN
  Administered 2014-09-18: 100 mL via INTRAVENOUS

## 2015-03-18 ENCOUNTER — Encounter (HOSPITAL_COMMUNITY): Payer: Self-pay | Admitting: Emergency Medicine

## 2015-03-18 ENCOUNTER — Emergency Department (HOSPITAL_COMMUNITY)
Admission: EM | Admit: 2015-03-18 | Discharge: 2015-03-18 | Disposition: A | Discharge status: Home / Self Care | Payer: 59 | Source: Home / Self Care | Attending: Family Medicine | Admitting: Family Medicine

## 2015-03-18 DIAGNOSIS — R899 Unspecified abnormal finding in specimens from other organs, systems and tissues: Secondary | ICD-10-CM | POA: Diagnosis not present

## 2015-03-18 LAB — POCT I-STAT, CHEM 8
BUN: 14 mg/dL (ref 6–23)
CALCIUM ION: 1.3 mmol/L — AB (ref 1.12–1.23)
Chloride: 103 mmol/L (ref 96–112)
Creatinine, Ser: 0.5 mg/dL (ref 0.50–1.10)
Glucose, Bld: 105 mg/dL — ABNORMAL HIGH (ref 70–99)
HCT: 46 % (ref 36.0–46.0)
HEMOGLOBIN: 15.6 g/dL — AB (ref 12.0–15.0)
Potassium: 4.1 mmol/L (ref 3.5–5.1)
SODIUM: 142 mmol/L (ref 135–145)
TCO2: 25 mmol/L (ref 0–100)

## 2015-03-18 NOTE — ED Provider Notes (Signed)
CSN: [id number]     Arrival date & time 03/18/15  1741 History   First MD Initiated Contact with Patient 03/18/15 1857     No chief complaint on file.  (Consider location/radiation/quality/duration/timing/severity/associated sxs/prior Treatment) Patient is a 59 y.o. female presenting with general illness. The history is provided by the patient.  Illness Onset quality:  Sudden Chronicity:  New Context:  Told by her md late today of high poassium 6.2, here for recheck of lab result.   Past Medical History  Diagnosis Date  . Hypertension    Past Surgical History  Procedure Laterality Date  . Back surgery     No family history on file. History  Substance Use Topics  . Smoking status: Never Smoker   . Smokeless tobacco: Not on file  . Alcohol Use: No   OB History    No data available     Review of Systems  Constitutional: Negative.   Respiratory: Negative.   Cardiovascular: Negative.     Allergies  Review of patient's allergies indicates no known allergies.  Home Medications   Prior to Admission medications   Medication Sig Start Date End Date Taking? Authorizing Provider  b complex vitamins capsule Take 1 capsule by mouth daily.   Yes Historical Provider, MD  Esomeprazole Magnesium (NEXIUM PO) Take by mouth.   Yes Historical Provider, MD  LOSARTAN POTASSIUM PO Take by mouth.   Yes Historical Provider, MD  traMADol (ULTRAM) 50 MG tablet Take by mouth every 6 (six) hours as needed.   Yes Historical Provider, MD  zolpidem (AMBIEN) 10 MG tablet Take 10 mg by mouth at bedtime as needed for sleep.   Yes Historical Provider, MD   BP 188/92 mmHg  Pulse 76  Temp(Src) 97.9 F (36.6 C) (Oral)  Resp 18  SpO2 100% Physical Exam  Constitutional: She is oriented to person, place, and time. She appears well-developed and well-nourished.  Neck: Neck supple.  Cardiovascular: Normal heart sounds and intact distal pulses.   Pulmonary/Chest: Effort normal and breath sounds normal.   Neurological: She is alert and oriented to person, place, and time.  Skin: Skin is warm and dry.  Nursing note and vitals reviewed.   ED Course  Procedures (including critical care time) Labs Review Labs Reviewed  POCT I-STAT, CHEM 8 - Abnormal; Notable for the following:    Glucose, Bld 105 (*)    Calcium, Ion 1.30 (*)    Hemoglobin 15.6 (*)    All other components within normal limits    Imaging Review No results found.   MDM   1. Abnormal laboratory test        Billy Fischer, MD 03/18/15 [phone]

## 2015-03-18 NOTE — Discharge Instructions (Signed)
Your potassium is normal, call your doctor in am to advise .

## 2015-03-26 ENCOUNTER — Encounter: Payer: Self-pay | Admitting: Neurology

## 2015-03-26 ENCOUNTER — Ambulatory Visit (INDEPENDENT_AMBULATORY_CARE_PROVIDER_SITE_OTHER): Payer: 59 | Admitting: Neurology

## 2015-03-26 VITALS — BP 123/77 | HR 59 | Resp 16 | Ht 66.0 in | Wt 160.0 lb

## 2015-03-26 DIAGNOSIS — M6281 Muscle weakness (generalized): Secondary | ICD-10-CM | POA: Diagnosis not present

## 2015-03-26 DIAGNOSIS — R29898 Other symptoms and signs involving the musculoskeletal system: Secondary | ICD-10-CM | POA: Diagnosis not present

## 2015-03-26 DIAGNOSIS — R208 Other disturbances of skin sensation: Secondary | ICD-10-CM | POA: Diagnosis not present

## 2015-03-26 DIAGNOSIS — R531 Weakness: Secondary | ICD-10-CM

## 2015-03-26 DIAGNOSIS — R200 Anesthesia of skin: Secondary | ICD-10-CM

## 2015-03-26 NOTE — Patient Instructions (Signed)
We will do workup for your weakness: blood work, electrical nerve and muscle testing, cervical spine MRI and will keep you posted as to the results, we will request records from Drs. Vertell Limber and Percell Miller

## 2015-03-26 NOTE — Progress Notes (Signed)
Subjective:    Patient ID: [name] [name] is a 59 y.o. female.  HPI     Star Age, MD, PhD [facility] [address].O. [place], [place] [address]  Dear Dr. Shelia Media,   I saw your patient, [name] [name], upon your kind request in my neurologic clinic today for initial consultation of her leg weakness. The patient is unaccompanied today. As you know, Ms. Philippie is a 59 year old right-handed woman with an underlying medical history of vitamin B12 deficiency, chronic low back pain, history of hearing loss and tinnitus, reflux disease, who has had a several year history of back pain with radiation and gradual onset of right leg numbness and weakness. Symptoms started in 2006 with right leg distal weakness and some numbness in the distal part of her right leg. Symptoms have gradually become worse over the last 10 years and now she also has some weakness in her left leg and more proximal weakness, right more than left and while she denies frank symptoms in the upper body she does notice very mild weakness perhaps in the right more than left upper extremity. She has no family history of neuropathy or progressive neuromuscular illnesses. She denies any dysarthria, dysphasia, facial weakness or droopy eyelids or diurnal fluctuation of her symptoms. She is primarily complaining of weakness as opposed to his sensory symptoms. She does have some numbness particularly in the right shin areas he feels. She has seen orthopedics and also saw Dr. Vertell Limber who repeated her lumbar spine MRI about a year or 2 ago perhaps. I do not have his records available for review. She saw Dr. Percell Miller in orthopedics. He recommended knee x-rays which per her report were nonrevealing. I do not have those records available for review but we will request records from Dr. Percell Miller and from Dr. Melven Sartorius office. She has trouble climbing uphill and climbing upstairs. She usually climbs one stair at a  time. She quit smoking over 20 years ago. She has not noticed any frank muscle bulk loss or fasciculations or any other involuntary movements, other than a longer standing history of tremors affecting both hands. She has a family history of tremors in her nephew, her brother's son. Her brother died at the age of 72 and at the time he had no tremors. She denies recurrent headaches. She denies any visual changes and her last brain scan for her pituitary tumor was years ago but she has no new symptoms. She has not had any recurrent falls and no recent falls but she has fallen in the past. She has mild low back pain. She has no significant sciatica symptoms. She had back surgery in 2007 but it did not help her symptoms very much.  I do not have any recent lower back imaging study results available for review. She had a lumbar spine MRI with and without contrast on 03/20/2006: 1. Shallow broad-based disk protrusion at L3-4, unchanged from the prior study.  2. Post op resection of disk fragments on the right at L4-5 without recurrent disk protrusion.       Prior to that, she had a L spine MRI on 04/15/05, which showed L4/5 disc protrusion.    She also has a history of a pituitary tumor. Her last brain MRI with and without contrast from 12/20/2010 showed Stable 3 x 5 x 7 mm area of differential enhancement in the left posterior paramedian pituitary consistent with a small microadenoma or pars intermedia cyst.  Otherwise negative  brain. In addition, I personally reviewed the images through the PACS system.  Her brain MRI with and without contrast prior to that was from 12/29/2009: 1.  Persistent small left posterior pituitary lesion could represent a microadenoma or proteinaceous pars intermedia cyst measuring 3 x 4 x 7 mm. 2.  Stable MRI appearance of the brain.  Moderate for age nonspecific white matter signal changes, favor chronic small vessel ischemia. In addition, I personally reviewed the images through the  PACS system.  Her brain MRI with and without contrast prior to that was from 07/05/2009 :No internal auditory canal enhancing lesion. Asymmetric appearance of the posterior pituitary gland as noted above.  Significance/etiology indeterminate.  Follow-up imaging (pituitary protocol) in 6 months months recommended (sooner if clinically.). Nonspecific white matter type changes as noted above.   Her Past Medical History Is Significant For: Past Medical History  Diagnosis Date  . Hypertension   . Pituitary tumor   . Goiter   . Elevated LFTs   . Vitamin B deficiency   . Chronic low back pain   . Hearing loss   . Tinnitus   . GERD (gastroesophageal reflux disease)     Her Past Surgical History Is Significant For: Past Surgical History  Procedure Laterality Date  . Back surgery      Her Family History Is Significant For: Family History  Problem Relation Age of Onset  . Lupus Mother   . Heart attack Mother   . Heart attack Father     Her Social History Is Significant For: History   Social History  . Marital Status: Married    Spouse Name: N/A  . Number of Children: 1  . Years of Education: Some colg   Occupational History  . Unemployeed    Social History Main Topics  . Smoking status: Former Research scientist (life sciences)  . Smokeless tobacco: Not on file     Comment: Stopped over 20 years  . Alcohol Use: 0.0 oz/week    0 Standard drinks or equivalent per week     Comment: Rare  . Drug Use: No  . Sexual Activity: Not on file   Other Topics Concern  . None   Social History Narrative   1-2 caffeine drinks a day     Her Allergies Are:  No Known Allergies:   Her Current Medications Are:  Outpatient Encounter Prescriptions as of 03/26/2015  Medication Sig  . ALPRAZolam (XANAX) 0.5 MG tablet Take 0.5 mg by mouth at bedtime as needed for anxiety.  Marland Kitchen b complex vitamins capsule Take 1 capsule by mouth daily.  . calcium-vitamin D 250-100 MG-UNIT per tablet Take 1 tablet by mouth 2 (two)  times daily.  . cetirizine (ZYRTEC) 10 MG tablet Take 10 mg by mouth daily.  . Esomeprazole Magnesium (NEXIUM PO) Take by mouth.  . losartan (COZAAR) 100 MG tablet Take 100 mg by mouth daily.  . traMADol (ULTRAM) 50 MG tablet Take by mouth every 6 (six) hours as needed.  . zolpidem (AMBIEN) 10 MG tablet Take 10 mg by mouth at bedtime as needed for sleep.  :   Review of Systems:  Out of a complete 14 point review of systems, all are reviewed and negative with the exception of these symptoms as listed below:   Review of Systems  Constitutional: Positive for fatigue.  HENT: Positive for hearing loss and tinnitus.   Musculoskeletal:       Joint pain, aching muscles, progressive bilateral leg weakness starting about 10 years ago.  Neurological:       Restless legs, Numbness in R leg, pain in both legs.   Psychiatric/Behavioral:       Anxiety   Objective:  Neurologic Exam  Physical Exam Physical Examination:   Filed Vitals:   03/26/15 1044  BP: 123/77  Pulse: 59  Resp: 16   General Examination: The patient is a very pleasant 59 y.o. female in no acute distress. She appears well-developed and well-nourished and well groomed.   HEENT: Normocephalic, atraumatic, pupils are equal, round and reactive to light and accommodation. Funduscopic exam is normal with sharp disc margins noted. Extraocular tracking is good without limitation to gaze excursion or nystagmus noted. Normal smooth pursuit is noted. Hearing is grossly intact. Tympanic membranes are clear bilaterally. Face is symmetric with normal facial animation and normal facial sensation. Speech is clear with no dysarthria noted. There is no hypophonia. There is no lip, neck/head, jaw or voice tremor. Neck is supple with full range of passive and active motion. There are no carotid bruits on auscultation. Oropharynx exam reveals: moderate mouth dryness, good dental hygiene and mild airway crowding. Mallampati is class II. Tongue  protrudes centrally and palate elevates symmetrically. At times I wondered if she sounds a little dysarthric but she denies dysarthria and this could be because her mouth was quite dry.  Chest: Clear to auscultation without wheezing, rhonchi or crackles noted.  Heart: S1+S2+0, regular and normal without murmurs, rubs or gallops noted.   Abdomen: Soft, non-tender and non-distended with normal bowel sounds appreciated on auscultation.  Extremities: There is no pitting edema in the distal lower extremities bilaterally. Pedal pulses are intact.  Skin: Warm and dry without trophic changes noted. There are no varicose veins.  Musculoskeletal: exam reveals no obvious joint deformities, tenderness or joint swelling or erythema.   Neurologically:  Mental status: The patient is awake, alert and oriented in all 4 spheres. Her immediate and remote memory, attention, language skills and fund of knowledge are appropriate. There is no evidence of aphasia, agnosia, apraxia or anomia. Speech is clear with normal prosody and enunciation. Thought process is linear. Mood is normal and affect is normal.  Cranial nerves II - XII are as described above under HEENT exam. In addition: shoulder shrug is normal with equal shoulder height noted. Motor exam: Normal bulk, and tone is noted. Strength is mildly abnormal in the upper body with mild proximal weakness noted in the right upper extremity and the 4+ out of 5 arena and slight weakness s noted in the proximal left upper extremity. I did not notice any fasciculations and no loss of muscle bulk is noted. She has no distal atrophy. She has no abnormal involuntary movements with the exception of a mild bilateral upper extremity postural and action tremor. She has no resting tremor and fine motor skills are preserved with the upper extremities but she has difficulty with heel to shin bilaterally. She has proximal weakness with particularly hip flexion in the right lower  extremity in the 4 minus out of 5 range. She also have has left hip flexor weakness. Right foot dorsiflexion is mildly weak. Reflexes are 2+ in the upper extremities, perhaps slightly brisker on the right but she has no Hoffman's. She has brisker reflexes in the right lower extremity with cross adductor responses, more pronounced on the right with some spread of reflexes noted in the right knee jerk. Toes are equivocal on the right and down on the left. She has no clonus.  Cerebellar  testing: No dysmetria or intention tremor on finger to nose testing. Heel to shin is essentially impossible on the right due to hip flexor weakness and limited on the left with no ataxia noted. Sensory exam is intact to light touch, pinprick, vibration and temperature sense throughout with the exception of mild decrease in small area in the right shin. She stands with difficulty and cannot stand without pushing herself up. She stands slightly wide-based. Romberg is negative. She has trouble with tandem walk and has to hold on. She walks insecurely and slightly wide-based. She turns in 3 steps.  Assessment and Plan:   In summary, Permelia L Marcus is a very pleasant 59 y.o.-year old female with an underlying medical history of vitamin B12 deficiency, chronic low back pain, history of hearing loss and tinnitus, reflux disease, who has had a several year history of back pain with radiation and gradual onset of right leg numbness and weakness.she has on examination right lower extremity weakness which is mostly with her right hip flexor. She has hyperreflexia in the right lower extremity but other milder findings as noted above. Symptoms have been ongoing for 10 years and slowly progressive. She has had workup through neurosurgery and orthopedics and we will request records. She may of had a lumbar spine MRI in the last year or 2. I will hold off on any scan of her lower back but since she does have some subtle changes in her upper  body, would like to proceed with the cervical spine MRI. She has had follow-up scans for her pituitary adenoma and while we can still consider a brain MRI think we have to focus on other issues first. Her symptoms do not suggest expanding of her pituitary adenoma. In particular, she has had no visual changes, no headaches. I would like to know the status of her B12 level. She has never had EMG nerve conduction testing. We will consider reimaging her lumbar spine as well. We will consider spinal fluid testing. At this juncture, I would like to proceed with blood work to look for muscle enzymes, inflammatory markers, HTLV, B12, and we will proceed with electrophysiologic testing of her limbs with EMG nerve conduction testing. We will keep her posted as to her test results and also schedule her for cervical spine MRI. I would like to see her back in a couple months for checkup. At this moment, I'm not sure as to the etiology of her symptoms.  Thank you very much for allowing me to participate in the care of this nice patient. If I can be of any further assistance to you please do not hesitate to call me at [phone].  Sincerely,   Star Age, MD, PhD

## 2015-03-27 LAB — SPECIMEN STATUS REPORT

## 2015-03-29 ENCOUNTER — Telehealth: Payer: Self-pay | Admitting: *Deleted

## 2015-03-29 NOTE — Telephone Encounter (Signed)
Release faxed to Dr. Vertell Limber & Dr. Percell Miller office on 03/29/15.

## 2015-03-30 LAB — MULTIPLE MYELOMA PANEL, SERUM
ALBUMIN/GLOB SERPL: 1.5 (ref 0.7–2.0)
ALPHA2 GLOB SERPL ELPH-MCNC: 0.6 g/dL (ref 0.4–1.2)
Albumin SerPl Elph-Mcnc: 4.1 g/dL (ref 3.2–5.6)
Alpha 1: 0.2 g/dL (ref 0.1–0.4)
B-Globulin SerPl Elph-Mcnc: 1 g/dL (ref 0.6–1.3)
GLOBULIN, TOTAL: 2.8 g/dL (ref 2.0–4.5)
Gamma Glob SerPl Elph-Mcnc: 0.9 g/dL (ref 0.5–1.6)
IGG (IMMUNOGLOBIN G), SERUM: 1003 mg/dL (ref 700–1600)
IGM (IMMUNOGLOBULIN M), SRM: 35 mg/dL (ref 26–217)
TOTAL PROTEIN: 6.9 g/dL (ref 6.0–8.5)

## 2015-03-30 LAB — CK: Total CK: 276 U/L — ABNORMAL HIGH (ref 24–173)

## 2015-03-30 LAB — TSH: TSH: 1.52 u[IU]/mL (ref 0.450–4.500)

## 2015-03-30 LAB — B12 AND FOLATE PANEL
Folate: 15.1 ng/mL (ref >3.0)
Vitamin B-12: 1038 pg/mL — ABNORMAL HIGH (ref 211–946)

## 2015-03-30 LAB — HTLV I+II ANTIBODIES, (EIA), BLD: HTLV I/II AB: NEGATIVE

## 2015-03-30 LAB — VITAMIN D 25 HYDROXY (VIT D DEFICIENCY, FRACTURES): VIT D 25 HYDROXY: 27.5 ng/mL — AB (ref 30.0–100.0)

## 2015-03-30 LAB — HGB A1C W/O EAG: Hgb A1c MFr Bld: 5.2 % (ref 4.8–5.6)

## 2015-03-30 LAB — SEDIMENTATION RATE: SED RATE: 2 mm/h (ref 0–40)

## 2015-03-30 LAB — ALDOLASE: Aldolase: 6.6 U/L (ref 3.3–10.3)

## 2015-03-30 LAB — C-REACTIVE PROTEIN: CRP: 1 mg/L (ref 0.0–4.9)

## 2015-03-30 LAB — RPR: RPR: NONREACTIVE

## 2015-03-31 ENCOUNTER — Ambulatory Visit (INDEPENDENT_AMBULATORY_CARE_PROVIDER_SITE_OTHER): Payer: 59

## 2015-03-31 DIAGNOSIS — R200 Anesthesia of skin: Secondary | ICD-10-CM

## 2015-03-31 DIAGNOSIS — R29898 Other symptoms and signs involving the musculoskeletal system: Secondary | ICD-10-CM | POA: Diagnosis not present

## 2015-03-31 DIAGNOSIS — R208 Other disturbances of skin sensation: Secondary | ICD-10-CM | POA: Diagnosis not present

## 2015-03-31 DIAGNOSIS — M6281 Muscle weakness (generalized): Secondary | ICD-10-CM | POA: Diagnosis not present

## 2015-03-31 DIAGNOSIS — R531 Weakness: Secondary | ICD-10-CM

## 2015-04-02 ENCOUNTER — Telehealth: Payer: Self-pay

## 2015-04-02 NOTE — Progress Notes (Signed)
Quick Note:  Please call patient regarding her blood test results: Mostly everything is in the normal range including inflammatory markers, diabetes marker, thyroid screening test. Her B12 level is slightly above range, which could indicate that she is taking vitamin B-12. this is not a concerning finding. Vitamin D is borderline low at it may help to start an over-the-counter vitamin D supplement, 1000-2000 units daily of any vitamin D supplement of her choice should be okay. Her muscle enzyme called CK is mildly elevated. Compared to a few years ago it is actually better than what it had been. We need to correlate with the upcoming EMG nerve conduction test findings, and she is scheduled for this test on 04/12/2015.  On the protein electrophoresis which shows [place] the protein breakdown in her blood, there was below normal immune globulin type A, and in isolation it does not explain any of her findings, this is probably an incidental finding.  No further action is required other than probably be checking her muscle enzyme levels down the road and rechecking her vitamin D level which could be done through her primary care physician after she has started a supplement. Star Age, MD, PhD [facility] ([facility])  ______

## 2015-04-02 NOTE — Telephone Encounter (Signed)
-----   Message from Star Age, MD sent at 04/02/2015  8:47 AM EDT ----- Please call patient regarding her blood test results: Mostly everything is in the normal range including inflammatory markers, diabetes marker, thyroid screening test. Her B12 level is slightly above range, which could indicate that she is taking  vitamin B-12. this is not a concerning finding. Vitamin D is borderline low at it may help to start an over-the-counter vitamin D supplement, 1000-2000 units daily of any vitamin D supplement of her choice should be okay. Her muscle enzyme called CK is mildly elevated. Compared to a few years ago it is actually better than what it had been. We need to correlate with the upcoming EMG nerve conduction test findings, and she is scheduled for this test on 04/12/2015.  On the protein electrophoresis which shows [place] the protein breakdown in her blood, there was below normal immune globulin type A, and in isolation it does not explain any of her findings, this is probably an incidental finding.  No further action is required other than probably be checking her muscle enzyme levels down the road and rechecking her vitamin D level which could be done through her primary care physician after she has started a supplement. Star Age, MD, PhD [facility])

## 2015-04-02 NOTE — Telephone Encounter (Signed)
Left message to call us back for lab work results.

## 2015-04-05 NOTE — Telephone Encounter (Signed)
Patient returned Beverlee Nims, [place] call regarding bw results.  Patient's on jury duty and will be available until 2 pm and after 5:30 pm.  May leave detailed message on voicemail.

## 2015-04-06 NOTE — Telephone Encounter (Signed)
I spoke to patient and she is aware of lab results. She is aware we will call her back with results of MRI and she stated that it was ok to leave the results in her vm if she is unable to answer.

## 2015-04-12 ENCOUNTER — Ambulatory Visit (INDEPENDENT_AMBULATORY_CARE_PROVIDER_SITE_OTHER): Payer: Self-pay | Admitting: Neurology

## 2015-04-12 ENCOUNTER — Encounter: Payer: Self-pay | Admitting: Neurology

## 2015-04-12 ENCOUNTER — Ambulatory Visit (INDEPENDENT_AMBULATORY_CARE_PROVIDER_SITE_OTHER): Payer: 59 | Admitting: Neurology

## 2015-04-12 DIAGNOSIS — M6281 Muscle weakness (generalized): Secondary | ICD-10-CM | POA: Diagnosis not present

## 2015-04-12 DIAGNOSIS — R531 Weakness: Secondary | ICD-10-CM

## 2015-04-12 DIAGNOSIS — M6289 Other specified disorders of muscle: Secondary | ICD-10-CM

## 2015-04-12 DIAGNOSIS — R200 Anesthesia of skin: Secondary | ICD-10-CM

## 2015-04-12 DIAGNOSIS — R29898 Other symptoms and signs involving the musculoskeletal system: Secondary | ICD-10-CM

## 2015-04-12 NOTE — Progress Notes (Signed)
Please refer to EMG and nerve conduction study procedure note. 

## 2015-04-12 NOTE — Procedures (Signed)
HISTORY:  [name] [name] is a 59 year old patient with a history of progressive weakness of the extremities for the last 10-15 years. The patient is having increasing difficulty getting up from a chair, climbing stairs. CK enzyme levels have been variable from the 270 to the 350 range. The patient is being evaluated for possible myopathic disorder.  NERVE CONDUCTION STUDIES:  Nerve conduction studies were performed on both lower extremities. The distal motor latencies and motor amplitudes for the peroneal and posterior tibial nerves were within normal limits. The nerve conduction velocities for these nerves were also normal. The H reflex latencies were normal. The sensory latencies for the peroneal nerves were within normal limits.   EMG STUDIES:  EMG study was performed on the right upper extremity:  The first dorsal interosseous muscle reveals 2 to 3 K units with full recruitment. No fibrillations or positive waves were noted. Moderate myotonic discharges were seen The abductor pollicis brevis muscle reveals 2 to 4 K units with full recruitment. No fibrillations or positive waves were noted. Mild myotonic discharges were seen. The extensor indicis proprius muscle reveals 1 to 3 K units with full recruitment. No fibrillations or positive waves were noted. Mild myotonic discharges were seen. The biceps muscle reveals 1 to 2 K units with full recruitment. No fibrillations or positive waves were noted. Mild to moderate myotonic discharges were seen. The triceps muscle reveals 2 to 3 K units with full recruitment. No fibrillations or positive waves were noted. Mild myotonic discharges were seen. The anterior deltoid muscle reveals 2 to 3 K units with full recruitment. No fibrillations or positive waves were noted. Mild-to-moderate nighttime discharges were seen. The cervical paraspinal muscles were tested at 2 levels. No abnormalities of insertional activity were seen at either level tested.  There was poor relaxation. Mild myotonic discharges were seen.  EMG study was performed on the right lower extremity:  The tibialis anterior muscle reveals 2 to 4K motor units with full recruitment. No fibrillations or positive waves were seen. Mild myotonic discharges were seen.  The peroneus tertius muscle reveals 2 to 4K motor units with full recruitment. No fibrillations or positive waves were seen.Mild myotonic discharges were seen.  The medial gastrocnemius muscle reveals 1 to 3K motor units with full recruitment. No fibrillations or positive waves were seen. Mild myotonic discharges were seen.  The vastus lateralis muscle reveals 2 to 4K motor units with full recruitment. No fibrillations or positive waves were seen. Moderate myotonic discharges were seen.  The iliopsoas muscle reveals 2 to 4K motor units with full recruitment. No fibrillations or positive waves were seen. Mild myotonic discharges were seen.  The lumbosacral paraspinal muscles were tested at 3 levels, and revealed no mild myotonic discharges at all 3 levels. Overall, there was a decrease in electively active muscle.  There was good relaxation.   IMPRESSION:  Nerve conduction studies done on both lower extremities were unremarkable. No evidence of a neuropathy is seen. EMG evaluation on the right arm and right leg shows diffuse proximal and distal mild myotonic discharges. Of note, on clinical examination, there is a delay in relaxation with grip on both hands, with repeated gripping, the stiffness improves. Given the EMG and clinical findings, possibility of a myotonic muscle disorder such as myotonia congenita (Thompson's disease) needs to be considered. Myotonic dystrophy is likely less likely.The patient reports no temperature sensitivity with the muscle stiffness, making paramyotonia congenita less likely.  Jill Alexanders MD 04/12/2015 1:56 PM  [facility] [address] [place], [place]  [address]  Phone [phone] Fax [phone]

## 2015-04-14 ENCOUNTER — Encounter: Payer: Self-pay | Admitting: Neurology

## 2015-04-14 ENCOUNTER — Ambulatory Visit (INDEPENDENT_AMBULATORY_CARE_PROVIDER_SITE_OTHER): Payer: 59 | Admitting: Neurology

## 2015-04-14 VITALS — BP 155/80 | HR 68 | Resp 16 | Ht 66.0 in | Wt 162.0 lb

## 2015-04-14 DIAGNOSIS — M6289 Other specified disorders of muscle: Secondary | ICD-10-CM | POA: Diagnosis not present

## 2015-04-14 DIAGNOSIS — M6281 Muscle weakness (generalized): Secondary | ICD-10-CM

## 2015-04-14 NOTE — Addendum Note (Signed)
Addended by: Star Age on: 04/14/2015 01:46 PM   Modules accepted: Orders

## 2015-04-14 NOTE — Patient Instructions (Signed)
I would to ask Dr. Jannifer Franklin to see you. I will ask him today and put a formal referral in.  I would like for you to consider genetic testing and we will be happy to talk to your insurance about coverage for the test.  For now, I will not order another test.

## 2015-04-14 NOTE — Addendum Note (Signed)
Addended by: Star Age on: 04/14/2015 01:47 PM   Modules accepted: Level of Service

## 2015-04-14 NOTE — Progress Notes (Signed)
Subjective:    Patient ID: [name] [name] is a 59 y.o. female.  HPI     Interim history:  [name] [name] is a 59 year old right-handed woman with an underlying medical history of vitamin B12 deficiency, chronic low back pain, history of hearing loss and tinnitus, reflux disease, who presents for follow-up consultation of her lower extremity weakness of many years duration. The patient is unaccompanied today. I first met her on 03/26/2015 at the request of her primary care physician, at which time she reported an at least 10 year history of right more than left leg weakness. She also reported some numbness in her legs. On examination, she had generalized mild weakness. This appeared to be more proximal than distal. She had mildly depressed reflexes particularly in the right lower extremity. I suggested further workup in the form of cervical spine MRI, blood testing and EMG and nerve conduction tests. Also requested records from her neurosurgeon and orthopedic doctors.   Blood work from 03/26/2015 showed mildly low vitamin D at 27.5, mildly abnormal CK level at 276, SPEP showed low IgA level, vitamin B12 mildly up at 1038, RPR unremarkable, ESR normal, TSH normal, aldolase normal, vitamin B1 normal. We called her with her test results.   She had EMG and nerve conduction testing to the right upper and lower extremities on 04/12/2015: I discussed findings with Dr. Jannifer Franklin at the time. I reviewed his report: Nerve conduction studies done on both lower extremities were unremarkable. No evidence of a neuropathy is seen. EMG evaluation on the right arm and right leg shows diffuse proximal and distal mild myotonic discharges. Of note, on clinical examination, there is a delay in relaxation with grip on both hands, with repeated gripping, the stiffness improves. Given the EMG and clinical findings, possibility of a myotonic muscle disorder such as myotonia congenita (Thompson's disease) needs to be considered.  Myotonic dystrophy is likely less likely.The patient reports no temperature sensitivity with the muscle stiffness, making paramyotonia congenita less likely.  Today, 04/14/2015: She reports feeling about the same. I had received records from her orthopedic doctor. I reviewed her test results with her in detail today, including blood work, MRI results, EMG test results. She has started taking an over-the-counter vitamin D supplement. I explained the EMG nerve conduction study findings. I also explained the cervical spine MRI results to her and showed her images on the screen. I personally reviewed the images through the PACS system and agree with her report. She reports no new symptoms. She says that her older brother, age 27 has had muscle problems as well and he had a muscle biopsy. Her youngest brother died at the age of 31 with some heart condition an enlarged heart from what she remembers.  Previously:. Has had a several year history of back pain with radiation and gradual onset of right leg numbness and weakness. Symptoms started in 2006 with right leg distal weakness and some numbness in the distal part of her right leg. Symptoms have gradually become worse over the last 10 years and now she also has some weakness in her left leg and more proximal weakness, right more than left and while she denies frank symptoms in the upper body she does notice very mild weakness perhaps in the right more than left upper extremity. She has no family history of neuropathy or progressive neuromuscular illnesses. She denies any dysarthria, dysphasia, facial weakness or droopy eyelids or diurnal fluctuation of her symptoms. She is primarily complaining of weakness as  opposed to his sensory symptoms. She does have some numbness particularly in the right shin areas he feels. She has seen orthopedics and also saw Dr. Vertell Limber who repeated her lumbar spine MRI about a year or 2 ago perhaps. I do not have his records available for  review. She saw Dr. Percell Miller in orthopedics. He recommended knee x-rays which per her report were nonrevealing. I do not have those records available for review but we will request records from Dr. Percell Miller and from Dr. Melven Sartorius office. She has trouble climbing uphill and climbing upstairs. She usually climbs one stair at a time. She quit smoking over 20 years ago. She has not noticed any frank muscle bulk loss or fasciculations or any other involuntary movements, other than a longer standing history of tremors affecting both hands. She has a family history of tremors in her nephew, her brother's son. Her brother died at the age of 13 and at the time he had no tremors. She denies recurrent headaches. She denies any visual changes and her last brain scan for her pituitary tumor was years ago but she has no new symptoms. She has not had any recurrent falls and no recent falls but she has fallen in the past. She has mild low back pain. She has no significant sciatica symptoms. She had back surgery in 2007 but it did not help her symptoms very much.  I do not have any recent lower back imaging study results available for review. She had a lumbar spine MRI with and without contrast on 03/20/2006: 1. Shallow broad-based disk protrusion at L3-4, unchanged from the prior study.  2. Post op resection of disk fragments on the right at L4-5 without recurrent disk protrusion.       Prior to that, she had a L spine MRI on 04/15/05, which showed L4/5 disc protrusion.    She also has a history of a pituitary tumor. Her last brain MRI with and without contrast from 12/20/2010 showed Stable 3 x 5 x 7 mm area of differential enhancement in the left posterior paramedian pituitary consistent with a small microadenoma or pars intermedia cyst.  Otherwise negative brain. In addition, I personally reviewed the images through the PACS system.  Her brain MRI with and without contrast prior to that was from 12/29/2009: 1.  Persistent small  left posterior pituitary lesion could represent a microadenoma or proteinaceous pars intermedia cyst measuring 3 x 4 x 7 mm. 2.  Stable MRI appearance of the brain.  Moderate for age nonspecific white matter signal changes, favor chronic small vessel ischemia. In addition, I personally reviewed the images through the PACS system.  Her brain MRI with and without contrast prior to that was from 07/05/2009 :No internal auditory canal enhancing lesion. Asymmetric appearance of the posterior pituitary gland as noted above.  Significance/etiology indeterminate.  Follow-up imaging (pituitary protocol) in 6 months months recommended (sooner if clinically.). Nonspecific white matter type changes as noted above.  Her Past Medical History Is Significant For: Past Medical History  Diagnosis Date  . Hypertension   . Pituitary tumor   . Goiter   . Elevated LFTs   . Vitamin B deficiency   . Chronic low back pain   . Hearing loss   . Tinnitus   . GERD (gastroesophageal reflux disease)     Her Past Surgical History Is Significant For: Past Surgical History  Procedure Laterality Date  . Back surgery      Her Family History Is Significant For:  Family History  Problem Relation Age of Onset  . Lupus Mother   . Heart attack Mother   . Heart attack Father     Her Social History Is Significant For: History   Social History  . Marital Status: Married    Spouse Name: N/A  . Number of Children: 1  . Years of Education: Some colg   Occupational History  . Unemployeed    Social History Main Topics  . Smoking status: Former Research scientist (life sciences)  . Smokeless tobacco: Not on file     Comment: Stopped over 20 years  . Alcohol Use: 0.0 oz/week    0 Standard drinks or equivalent per week     Comment: Rare  . Drug Use: No  . Sexual Activity: Not on file   Other Topics Concern  . None   Social History Narrative   1-2 caffeine drinks a day     Her Allergies Are:  No Known Allergies:   Her Current  Medications Are:  Outpatient Encounter Prescriptions as of 04/14/2015  Medication Sig  . ALPRAZolam (XANAX) 0.5 MG tablet Take 0.5 mg by mouth at bedtime as needed for anxiety.  Marland Kitchen b complex vitamins capsule Take 1 capsule by mouth daily.  . calcium-vitamin D 250-100 MG-UNIT per tablet Take 1 tablet by mouth 2 (two) times daily.  . cetirizine (ZYRTEC) 10 MG tablet Take 10 mg by mouth daily.  . Esomeprazole Magnesium (NEXIUM PO) Take by mouth.  . losartan (COZAAR) 100 MG tablet Take 100 mg by mouth daily.  . traMADol (ULTRAM) 50 MG tablet Take by mouth every 6 (six) hours as needed.  . zolpidem (AMBIEN) 10 MG tablet Take 10 mg by mouth at bedtime as needed for sleep.   No facility-administered encounter medications on file as of 04/14/2015.  :  Review of Systems:  Out of a complete 14 point review of systems, all are reviewed and negative with the exception of these symptoms as listed below:  Review of Systems  Neurological: Positive for weakness.    Objective:  Neurologic Exam  Physical Exam Physical Examination:   Filed Vitals:   04/14/15 0949  BP: 155/80  Pulse: 68  Resp: 16   General Examination: The patient is a very pleasant 59 y.o. female in no acute distress. She appears well-developed and well-nourished and well groomed.   HEENT: Normocephalic, atraumatic, pupils are equal, round and reactive to light and accommodation. Funduscopic exam is normal with sharp disc margins noted. Extraocular tracking is good without limitation to gaze excursion or nystagmus noted. Normal smooth pursuit is noted, unchanged. Hearing is grossly intact. Tympanic membranes are clear bilaterally. Face is symmetric with normal facial animation and normal facial sensation. Speech is clear with perhaps minimal dysarthria noted. There is no hypophonia. There is no lip, neck/head, jaw or voice tremor. Neck is supple with full range of passive and active motion. There are no carotid bruits on auscultation.  Oropharynx exam reveals: moderate mouth dryness, good dental hygiene and mild airway crowding. Mallampati is class II. Tongue protrudes centrally and palate elevates symmetrically.   Chest: Clear to auscultation without wheezing, rhonchi or crackles noted.  Heart: S1+S2+0, regular and normal without murmurs, rubs or gallops noted.   Abdomen: Soft, non-tender and non-distended with normal bowel sounds appreciated on auscultation.  Extremities: There is no pitting edema in the distal lower extremities bilaterally. Pedal pulses are intact.  Skin: Warm and dry without trophic changes noted. There are no varicose veins.  Musculoskeletal: exam reveals  no obvious joint deformities, tenderness or joint swelling or erythema.   Neurologically:  Mental status: The patient is awake, alert and oriented in all 4 spheres. Her immediate and remote memory, attention, language skills and fund of knowledge are appropriate. There is no evidence of aphasia, agnosia, apraxia or anomia. Speech is clear with normal prosody and enunciation. Thought process is linear. Mood is normal and affect is normal.  Cranial nerves II - XII are as described above under HEENT exam. In addition: shoulder shrug is normal with equal shoulder height noted. Motor exam: Normal bulk, and tone is noted. Strength is mildly abnormal in the upper body with mild proximal weakness noted in the right upper extremity and the 4+ out of 5 arena and slight weakness noted in the proximal left upper extremity. she does have some difficulty releasing grip. Grip is also slightly weaker than expected. I did not notice any fasciculations and no loss of muscle bulk is noted. She has no distal atrophy. She has no abnormal involuntary movements with the exception of a mild bilateral upper extremity postural and action tremor. She has no resting tremor and fine motor skills are preserved with the upper extremities but she has difficulty with heel to shin  bilaterally. She has proximal weakness with particularly hip flexion in the right lower extremity in the 4 minus out of 5 range. She also have has left hip flexor weakness. Right foot dorsiflexion is mildly weak. Reflexes are 2+ in the upper extremities, perhaps slightly brisker on the right but she has no Hoffman's. She has brisker reflexes in the right lower extremity with cross adductor responses, more pronounced on the right with some spread of reflexes noted in the right knee jerk. Toes are equivocal on the right and down on the left. She has no clonus.  Cerebellar testing: No dysmetria or intention tremor on finger to nose testing. Heel to shin is essentially impossible on the right due to hip flexor weakness and limited on the left with no ataxia noted. Sensory exam is intact to light touch, pinprick, vibration and temperature sense throughout with the exception of mild decrease in small area in the right shin. She stands with difficulty and cannot stand without pushing herself up. She stands slightly wide-based. Romberg is negative. She has trouble with tandem walk and has to hold on. She walks insecurely and slightly wide-based. She turns in 3 steps.  Assessment and Plan:   In summary, [name] [name] is a very pleasant 59 year old female with an underlying medical history of vitamin B12 deficiency, chronic low back pain, history of hearing loss and tinnitus, reflux disease, who has had a several year history of back pain with radiation and gradual onset of right leg numbness and weakness. symptoms go back to over 10 years. They have been progressive, more so on the last few years. Her workup thus far is concerning for a myopathy. According to the EMG and nerve conduction test results she could have myotonia. She may have a hereditary condition. We will look into doing genetic testing with a send out lab for the myotonia panel. She understands that we would look into insurance coverage for this as  these are typically very costly tests and often not well covered under insurance is. She indicates that she will not be able to afford it if there is a significant amount of money involved that she would have to pay out of pocket. It is completely understandable. I've also discussed with her  all her test results including blood test results, we reviewed her cervical spine MRI which showed some milder degenerative multilevel changes but nothing wrong with her spinal cord and nothing to explain her symptoms. At this juncture, I have also discussed with her referring her out to my colleague, Dr. Jannifer Franklin, who is experienced with neuromuscular diseases and perform the EMG on her recently. She would be willing to make a switch. I talked to Dr. Jannifer Franklin and he has kindly agreed to take her on as a patient. I think she will be in great hands. We will look into [facility] testing for her and give her a call. Furthermore, we will then also schedule her for follow-up with my colleague Dr. Jannifer Franklin.  I answered all her questions today and the patient was in agreement. She is encouraged to call for any interim questions, concerns or problems.  I spent 25 minutes in total face-to-face time with the patient, more than 50% of which was spent in counseling and coordination of care, reviewing test results, reviewing medication and discussing or reviewing the diagnosis of myopathy/myotonia, the prognosis and treatment options.

## 2015-04-15 ENCOUNTER — Telehealth: Payer: Self-pay

## 2015-04-15 NOTE — Telephone Encounter (Signed)
Left message to call me back.

## 2015-04-15 NOTE — Telephone Encounter (Signed)
I spoke to patient. She is aware that [employer] is out of network for her. The test code #5506 will cost her $[phone]. She is unable to afford that. I gave her Nestor Ramp' phone number and test code so she can find out if she can get financial assistance through them. We also made a Neuro consult with Dr. Jannifer Franklin who will be taking over her care.

## 2015-04-28 ENCOUNTER — Ambulatory Visit (INDEPENDENT_AMBULATORY_CARE_PROVIDER_SITE_OTHER): Payer: Self-pay | Admitting: Neurology

## 2015-04-28 ENCOUNTER — Encounter: Payer: Self-pay | Admitting: Neurology

## 2015-04-28 ENCOUNTER — Ambulatory Visit (INDEPENDENT_AMBULATORY_CARE_PROVIDER_SITE_OTHER): Payer: 59 | Admitting: Neurology

## 2015-04-28 VITALS — BP 146/85 | HR 69 | Ht 67.0 in | Wt 162.2 lb

## 2015-04-28 DIAGNOSIS — M6289 Other specified disorders of muscle: Secondary | ICD-10-CM

## 2015-04-28 DIAGNOSIS — M6281 Muscle weakness (generalized): Secondary | ICD-10-CM

## 2015-04-28 MED ORDER — MEXILETINE HCL 150 MG PO CAPS: ORAL_CAPSULE | ORAL | Status: DC

## 2015-04-28 NOTE — Procedures (Signed)
      HISTORY:  [name] [name] is a 59 year old patient with a 20 year history of muscle weakness involving the arms and legs. The patient has been noted to have myotonic type discharges on EMG. She is being evaluated for possible Thompsen's disease.  NERVE CONDUCTION STUDIES:  Repetitive nerve stimulation study was performed on the right arm with pickup on the APB muscle. The response revealed a mild prolongation of the distal motor latency of 4.1, with a normal motor amplitude. The muscle was fatigued for 1 minute, and a 3 Hz stimulation was done at 15 seconds, 30 seconds 1 minute, 2 minutes, 3 minutes, 4 minutes, and 5 minutes following muscle fatigue. No evidence of an incremental or decremental response was seen. Baseline was stable.  A repetitive nerve stimulation study was done at 40 Hz. No evidence of an incremental or decremental response was seen. Baseline was stable.  IMPRESSION:  Repetitive nerve stimulation study done today was unremarkable. No evidence of a neuromuscular transmission disorder was seen.  Jill Alexanders MD 04/28/2015 12:55 PM  [facility] [address] [place], [place] [address]  Phone [phone] Fax [phone]

## 2015-04-28 NOTE — Progress Notes (Signed)
Please refer to repetitive nerve stimulation study procedure note.

## 2015-04-28 NOTE — Progress Notes (Signed)
Reason for visit: Muscle weakness  [name] [name] is an 59 y.o. female  History of present illness:  [name] [name] is a 59 year old right-handed white female with a history of a progressive muscle weakness disorder dating back to her mid to late 71s. The patient has had documented low-grade CK enzyme elevations. She has had progressive proximal arm and leg weakness, but also weakness in the hands. The patient is having increasing problems with climbing stairs, getting up out of chairs. She will fall on occasion. She reports issues with diffuse muscular discomfort, particularly in the legs. EMG and nerve conduction study revealed evidence of myotonic type discharges. Clinical examination has shown difficulty with relaxation with strong grip initially, but this improves with repeated attempts. The patient has been set up for genetic testing, but she cannot afford this as the cost is around $6000. Her insurance will not cover the testing. She returns for an evaluation.  Past Medical History  Diagnosis Date  . Hypertension   . Pituitary tumor   . Goiter   . Elevated LFTs   . Vitamin B deficiency   . Chronic low back pain   . Hearing loss   . Tinnitus   . GERD (gastroesophageal reflux disease)     Past Surgical History  Procedure Laterality Date  . Back surgery      Family History  Problem Relation Age of Onset  . Lupus Mother   . Heart attack Mother   . Heart attack Father     Social history:  reports that she has quit smoking. She does not have any smokeless tobacco history on file. She reports that she drinks alcohol. She reports that she does not use illicit drugs.   No Known Allergies  Medications:  Prior to Admission medications   Medication Sig Start Date End Date Taking? Authorizing Provider  ALPRAZolam Duanne Moron) 0.5 MG tablet Take 0.5 mg by mouth at bedtime as needed for anxiety.   Yes Historical Provider, MD  b complex vitamins capsule Take 1 capsule by mouth  daily.   Yes Historical Provider, MD  calcium-vitamin D 250-100 MG-UNIT per tablet Take 1 tablet by mouth 2 (two) times daily.   Yes Historical Provider, MD  cetirizine (ZYRTEC) 10 MG tablet Take 10 mg by mouth daily.   Yes Historical Provider, MD  Esomeprazole Magnesium (NEXIUM PO) Take by mouth.   Yes Historical Provider, MD  losartan (COZAAR) 100 MG tablet Take 100 mg by mouth daily. 02/16/15  Yes Historical Provider, MD  traMADol (ULTRAM) 50 MG tablet Take by mouth every 6 (six) hours as needed.   Yes Historical Provider, MD  zolpidem (AMBIEN) 10 MG tablet Take 10 mg by mouth at bedtime as needed for sleep.   Yes Historical Provider, MD    ROS:  Out of a complete 14 system review of symptoms, the patient complains only of the following symptoms, and all other reviewed systems are negative.  Fatigue Hearing loss, ringing in the ears Anemia Joint pain, achy muscles Runny nose  Blood pressure 146/85, pulse 69, height 5\' 7"  (1.702 m), weight 162 lb 3.2 oz (73.573 kg).  Physical Exam  General: The patient is alert and cooperative at the time of the examination.  Skin: No significant peripheral edema is noted.   Neurologic Exam  Mental status: The patient is alert and oriented x 3 at the time of the examination. The patient has apparent normal recent and remote memory, with an apparently normal attention span and  concentration ability.   Cranial nerves: Facial symmetry is present. Speech is very slightly dysarthric. No definite facial muscle weakness is seen. The patient has weakness with neck flexion, not with extension. Extraocular movements are full. Visual fields are full.  Motor: The patient has 4/5 strength in the deltoids, and 4+/5 strength with the biceps and triceps bilaterally. With repeated muscle contractions, the muscle strength improves. With the lower extremities, there is 4 minus/5 strength proximally in both legs and with knee extension, once again, this seems to  improve with repeated attempts, but not normalize.  Sensory examination: Soft touch sensation is symmetric on the face, arms, and legs.  Coordination: The patient has good finger-nose-finger and heel-to-shin bilaterally.  Gait and station: The patient has a wide-based, slightly unsteady gait. Tandem gait is unsteady. Romberg is negative. No drift is seen. The patient has difficulty arising from a seated position with arms crossed.  Reflexes: Deep tendon reflexes are symmetric.   Assessment/Plan:  1. Myopathy, myotonic disorder  The patient appears to have a 20+ year history of a gradually progressive muscle weakness associated with myotonia. The patient has difficulty with relaxation with strong grip, but this improves with repeated attempts. Muscle strength also improves in arms and legs with repeated muscle contraction. The patient will be sent for repetitive nerve stimulation. The patient will be sent to Dr. Vallarie Mare at [facility] for a second opinion. A muscle biopsy has not been set up, if it is felt that this may be of some benefit, we can arrange for this. I will give the patient a trial on mexiletine. It is possible that the patient may have a myotonic disorder such as Thompsen's disease. The patient otherwise will follow-up in 3 months. LEMS antibodies may be checked depending upon the results of the repetitive nerve stimulation study.  [name] Alexanders MD 04/28/2015 7:15 PM  [facility] [address] [place], [place] [address]  Phone [phone] Fax [phone]

## 2015-05-27 ENCOUNTER — Ambulatory Visit: Payer: 59 | Admitting: Neurology

## 2015-07-13 ENCOUNTER — Other Ambulatory Visit (HOSPITAL_COMMUNITY): Payer: Self-pay | Admitting: Respiratory Therapy

## 2015-07-13 DIAGNOSIS — R0602 Shortness of breath: Secondary | ICD-10-CM

## 2015-07-22 ENCOUNTER — Encounter (HOSPITAL_COMMUNITY): Payer: 59

## 2015-07-22 ENCOUNTER — Ambulatory Visit (HOSPITAL_COMMUNITY)
Admission: RE | Admit: 2015-07-22 | Discharge: 2015-07-22 | Disposition: A | Discharge status: Home / Self Care | Payer: 59 | Source: Ambulatory Visit | Attending: Internal Medicine | Admitting: Internal Medicine

## 2015-07-22 DIAGNOSIS — R0602 Shortness of breath: Secondary | ICD-10-CM | POA: Diagnosis present

## 2015-07-22 LAB — PULMONARY FUNCTION TEST
DL/VA % pred: 95 %
DL/VA: 4.82 ml/min/mmHg/L
DLCO unc % pred: 83 %
DLCO unc: 22.64 ml/min/mmHg
FEF 25-75 POST: 2.69 L/s
FEF 25-75 Pre: 2 L/sec
FEF2575-%CHANGE-POST: 34 %
FEF2575-%PRED-POST: 108 %
FEF2575-%Pred-Pre: 80 %
FEV1-%Change-Post: 7 %
FEV1-%PRED-POST: 98 %
FEV1-%Pred-Pre: 91 %
FEV1-Post: 2.7 L
FEV1-Pre: 2.52 L
FEV1FVC-%CHANGE-POST: 3 %
FEV1FVC-%PRED-PRE: 96 %
FEV6-%Change-Post: 6 %
FEV6-%Pred-Post: 101 %
FEV6-%Pred-Pre: 95 %
FEV6-Post: 3.48 L
FEV6-Pre: 3.27 L
FEV6FVC-%Change-Post: 2 %
FEV6FVC-%Pred-Post: 103 %
FEV6FVC-%Pred-Pre: 101 %
FVC-%CHANGE-POST: 4 %
FVC-%Pred-Post: 98 %
FVC-%Pred-Pre: 94 %
FVC-PRE: 3.35 L
FVC-Post: 3.49 L
POST FEV1/FVC RATIO: 77 %
PRE FEV1/FVC RATIO: 75 %
Post FEV6/FVC ratio: 100 %
Pre FEV6/FVC Ratio: 98 %
RV % PRED: 115 %
RV: 2.42 L
TLC % pred: 107 %
TLC: 5.73 L

## 2015-07-22 MED ORDER — ALBUTEROL SULFATE (2.5 MG/3ML) 0.083% IN NEBU
2.5000 mg | INHALATION_SOLUTION | Freq: Once | RESPIRATORY_TRACT | Status: AC
  Administered 2015-07-22: 2.5 mg via RESPIRATORY_TRACT

## 2015-07-27 ENCOUNTER — Encounter (HOSPITAL_COMMUNITY): Payer: 59

## 2015-09-03 ENCOUNTER — Ambulatory Visit: Payer: 59 | Admitting: Neurology

## 2015-09-17 ENCOUNTER — Ambulatory Visit: Payer: 59 | Admitting: Neurology

## 2015-10-28 ENCOUNTER — Other Ambulatory Visit: Payer: Self-pay | Admitting: Endocrinology

## 2015-10-28 DIAGNOSIS — E049 Nontoxic goiter, unspecified: Secondary | ICD-10-CM

## 2015-11-10 ENCOUNTER — Other Ambulatory Visit: Payer: 59

## 2015-11-15 ENCOUNTER — Ambulatory Visit
Admission: RE | Admit: 2015-11-15 | Discharge: 2015-11-15 | Disposition: A | Discharge status: Home / Self Care | Payer: 59 | Source: Ambulatory Visit | Attending: Endocrinology | Admitting: Endocrinology

## 2015-11-15 DIAGNOSIS — E049 Nontoxic goiter, unspecified: Secondary | ICD-10-CM

## 2016-01-10 ENCOUNTER — Encounter: Payer: Self-pay | Admitting: Internal Medicine

## 2016-10-26 ENCOUNTER — Other Ambulatory Visit: Payer: Self-pay | Admitting: Internal Medicine

## 2016-10-26 DIAGNOSIS — R945 Abnormal results of liver function studies: Secondary | ICD-10-CM

## 2016-11-07 ENCOUNTER — Other Ambulatory Visit: Payer: Self-pay | Admitting: Obstetrics and Gynecology

## 2016-11-07 DIAGNOSIS — R928 Other abnormal and inconclusive findings on diagnostic imaging of breast: Secondary | ICD-10-CM

## 2016-11-10 ENCOUNTER — Ambulatory Visit
Admission: RE | Admit: 2016-11-10 | Discharge: 2016-11-10 | Disposition: A | Discharge status: Home / Self Care | Payer: BLUE CROSS/BLUE SHIELD | Source: Ambulatory Visit | Attending: Obstetrics and Gynecology | Admitting: Obstetrics and Gynecology

## 2016-11-10 DIAGNOSIS — R928 Other abnormal and inconclusive findings on diagnostic imaging of breast: Secondary | ICD-10-CM

## 2018-02-06 ENCOUNTER — Encounter: Payer: Self-pay | Admitting: Internal Medicine

## 2018-11-06 ENCOUNTER — Other Ambulatory Visit (HOSPITAL_COMMUNITY): Payer: Self-pay | Admitting: Obstetrics and Gynecology

## 2018-11-06 DIAGNOSIS — E049 Nontoxic goiter, unspecified: Secondary | ICD-10-CM

## 2018-11-06 DIAGNOSIS — E041 Nontoxic single thyroid nodule: Secondary | ICD-10-CM

## 2018-11-14 ENCOUNTER — Ambulatory Visit (HOSPITAL_COMMUNITY): Payer: BLUE CROSS/BLUE SHIELD

## 2018-11-14 ENCOUNTER — Encounter (HOSPITAL_COMMUNITY): Payer: Self-pay

## 2018-12-31 ENCOUNTER — Other Ambulatory Visit: Payer: Self-pay | Admitting: Cardiology

## 2018-12-31 DIAGNOSIS — G7111 Myotonic muscular dystrophy: Secondary | ICD-10-CM

## 2019-01-14 ENCOUNTER — Other Ambulatory Visit: Payer: Self-pay

## 2019-01-21 ENCOUNTER — Ambulatory Visit: Payer: Self-pay | Admitting: Cardiology

## 2020-01-28 ENCOUNTER — Ambulatory Visit
Admission: RE | Admit: 2020-01-28 | Discharge: 2020-01-28 | Disposition: A | Discharge status: Home / Self Care | Payer: BLUE CROSS/BLUE SHIELD | Source: Ambulatory Visit | Attending: Internal Medicine | Admitting: Internal Medicine

## 2020-01-28 ENCOUNTER — Other Ambulatory Visit: Payer: Self-pay | Admitting: Internal Medicine

## 2020-01-28 DIAGNOSIS — M62830 Muscle spasm of back: Secondary | ICD-10-CM

## 2020-03-01 ENCOUNTER — Encounter: Payer: 59 | Admitting: Physical Medicine and Rehabilitation

## 2020-04-05 ENCOUNTER — Encounter: Payer: 59 | Admitting: Physical Medicine and Rehabilitation

## 2020-06-16 ENCOUNTER — Other Ambulatory Visit: Payer: Self-pay

## 2020-06-16 ENCOUNTER — Ambulatory Visit: Payer: 59 | Admitting: Neurology

## 2020-06-16 ENCOUNTER — Encounter: Payer: Self-pay | Admitting: Neurology

## 2020-06-16 DIAGNOSIS — G7111 Myotonic muscular dystrophy: Secondary | ICD-10-CM

## 2020-06-16 HISTORY — DX: Myotonic muscular dystrophy: G71.11

## 2020-06-16 NOTE — Progress Notes (Signed)
Reason for visit: Myotonic dystrophy type II  Referring physician: Dr. Ashley Akin [name] is a 64 y.o. female  History of present illness:  [name] [name] is a 64 year old right-handed white female with a history of progressive proximal muscle weakness that began in her early 65s.  Just within the last year she has had to convert from using a cane for ambulation to a walker.  She is having increasing problems with walking longer distances, she cannot go up a flight of stairs.  The patient has not had any recent falls.  She does have some low back pain and leg pain with walking, she feels better when resting.  She was seen through this office in 2016, she was referred to [facility] after the EMG study showed a myotonic disorder.  Genetic testing was done and confirmed the presence of myotonic dystrophy type II.  The patient has no family history of this, but she does recall that her mother had trouble going up stairs as she got older.  The patient does have some Ultram to take if needed for pain, she takes Ambien for sleep at night.  She has been seen through a cardiologist in this area, but she has not been seen in a couple years.  There have been no problems with heart rhythm issues.  The patient does have 1 son who complains of low back pain but otherwise has no muscular weakness.  He is around 65 years old.  She comes to this office for further evaluation.  The patient indicates no problems with speech or swallowing, she does have some mild memory issues.  She reports that she feels cold with her hands and feet but no true numbness is noted.  She denies issues controlling the bowels or the bladder.  Past Medical History:  Diagnosis Date  . Chronic low back pain   . Elevated LFTs   . GERD (gastroesophageal reflux disease)   . Goiter   . Hearing loss   . Hypertension   . Pituitary tumor   . Tinnitus   . Vitamin B deficiency     Past Surgical History:  Procedure Laterality Date  . BACK  SURGERY      Family History  Problem Relation Age of Onset  . Lupus Mother   . Heart attack Mother   . Heart attack Father     Social history:  reports that she has quit smoking. She has never used smokeless tobacco. She reports current alcohol use of about 1.0 standard drink of alcohol per week. She reports that she does not use drugs.  Medications:  Prior to Admission medications   Medication Sig Start Date End Date Taking? Authorizing Provider  ALPRAZolam Duanne Moron) 0.5 MG tablet Take 0.5 mg by mouth at bedtime as needed for anxiety.   Yes [provider]  b complex vitamins capsule Take 1 capsule by mouth daily.   Yes [provider]  calcium-vitamin D 250-100 MG-UNIT per tablet Take 1 tablet by mouth 2 (two) times daily.   Yes [provider]  cetirizine (ZYRTEC) 10 MG tablet Take 10 mg by mouth daily.   Yes [provider]  cyanocobalamin 1000 MCG tablet Take by mouth.   Yes [provider]  esomeprazole (NEXIUM) 10 MG packet Take by mouth.   Yes [provider]  HYDROcodone-acetaminophen (NORCO/VICODIN) 5-325 MG tablet Take 1 tablet by mouth every 4 (four) hours as needed.  02/10/20  Yes [provider]  losartan (  COZAAR) 100 MG tablet Take 100 mg by mouth daily. 02/16/15  Yes [provider]  Multiple Vitamin (MULTI-VITAMIN) tablet Take by mouth.   Yes [provider]  traMADol (ULTRAM) 50 MG tablet Take by mouth every 6 (six) hours as needed. Take two twice a day   Yes [provider]  zolpidem (AMBIEN) 10 MG tablet Take 10 mg by mouth at bedtime as needed for sleep.   Yes [provider]     No Known Allergies  ROS:  Out of a complete 14 system review of symptoms, the patient complains only of the following symptoms, and all other reviewed systems are negative.  Walking difficulty Weakness Low back pain, leg pain  Blood pressure 122/79, pulse 66, height 5\' 6"  (1.676 m), weight  169 lb 6.4 oz (76.8 kg).  Physical Exam  General: The patient is alert and cooperative at the time of the examination.  Eyes: Pupils are equal, round, and reactive to light. Discs are flat bilaterally.  Neck: The neck is supple, no carotid bruits are noted.  Respiratory: The respiratory examination is clear.  Cardiovascular: The cardiovascular examination reveals a regular rate and rhythm, no obvious murmurs or rubs are noted.  Skin: Extremities are without significant edema.  Neurologic Exam  Mental status: The patient is alert and oriented x 3 at the time of the examination. The patient has apparent normal recent and remote memory, with an apparently normal attention span and concentration ability.  Cranial nerves: Facial symmetry is present. There is good sensation of the face to pinprick and soft touch bilaterally. The strength of the facial muscles and the muscles to head turning and shoulder shrug are normal bilaterally. Speech is well enunciated, no aphasia or dysarthria is noted. Extraocular movements are full. Visual fields are full. The tongue is midline, and the patient has symmetric elevation of the soft palate. No obvious hearing deficits are noted.  There is some weakness with neck flexion, not with extension  Motor: The motor testing reveals 5 over 5 strength of all 4 extremities, with exception of 4/5 strength with intrinsic muscles of the hands bilaterally and 4 -/5 strength with hip flexion bilaterally. Good symmetric motor tone is noted throughout.  Sensory: Sensory testing is intact to pinprick, soft touch, vibration sensation, and position sense on all 4 extremities, with exception of some decreased pinprick station on the right lower leg as compared to the left. No evidence of extinction is noted.  Coordination: Cerebellar testing reveals good finger-nose-finger and heel-to-shin bilaterally.  Gait and station: Gait is slightly wide-based, the patient has a lot of  difficulty arising from a seated position.  The patient is using a walker for ambulation.  Reflexes: Deep tendon reflexes are symmetric and normal bilaterally. Toes are downgoing bilaterally.   Assessment/Plan:  1.  Myotonic dystrophy type II  2.  Gait disorder  The patient is slowly getting worse with her leg strength.  She is to perform some toning exercises with the muscles of the legs.  The patient has Ultram to take if needed for leg pain.  Although cardiac issues are less common with nighttime dyspnea type II, I would recommend regular follow-up with her cardiologist.  She is to follow-up here in about 8 months.  Jill Alexanders MD 06/16/2020 11:49 AM  [facility] [address] [place], [place] [address]  Phone [phone] Fax [phone]

## 2020-10-23 ENCOUNTER — Ambulatory Visit: Payer: Self-pay | Attending: Internal Medicine

## 2020-10-23 DIAGNOSIS — Z23 Encounter for immunization: Secondary | ICD-10-CM

## 2020-10-23 NOTE — Progress Notes (Signed)
   Covid-19 Vaccination Clinic  Name:  [name] [name]    MRN: [id number] DOB: 16-Feb-1956  10/23/2020  [name] was observed post Covid-19 immunization for 15 minutes without incident. She was provided with Vaccine Information Sheet and instruction to access the V-Safe system.   [name] was instructed to call 911 with any severe reactions post vaccine: Marland Kitchen Difficulty breathing  . Swelling of face and throat  . A fast heartbeat  . A bad rash all over body  . Dizziness and weakness   Immunizations Administered    Name Date Dose VIS Date Route   Pfizer COVID-19 Vaccine 10/23/2020 12:13 PM 0.3 mL 09/29/2020 Intramuscular   Manufacturer: [place]   Lot: [id number]   [place]: [id number]

## 2020-12-02 ENCOUNTER — Other Ambulatory Visit: Payer: Self-pay | Admitting: Obstetrics and Gynecology

## 2020-12-02 ENCOUNTER — Other Ambulatory Visit (HOSPITAL_COMMUNITY): Payer: Self-pay | Admitting: Obstetrics and Gynecology

## 2020-12-02 DIAGNOSIS — E041 Nontoxic single thyroid nodule: Secondary | ICD-10-CM

## 2020-12-08 ENCOUNTER — Ambulatory Visit (HOSPITAL_COMMUNITY): Payer: Medicare Other

## 2020-12-13 ENCOUNTER — Ambulatory Visit (HOSPITAL_COMMUNITY): Admission: RE | Admit: 2020-12-13 | Payer: Medicare Other | Source: Ambulatory Visit

## 2020-12-13 ENCOUNTER — Encounter (HOSPITAL_COMMUNITY): Payer: Self-pay

## 2020-12-22 ENCOUNTER — Ambulatory Visit (HOSPITAL_COMMUNITY): Payer: Medicare Other

## 2021-02-21 ENCOUNTER — Ambulatory Visit: Payer: Medicare Other | Admitting: Neurology

## 2021-04-25 ENCOUNTER — Telehealth: Payer: Self-pay

## 2021-04-25 ENCOUNTER — Telehealth: Payer: Self-pay | Admitting: Neurology

## 2021-04-25 NOTE — Telephone Encounter (Signed)
Pt is asking for a call re: who her f/u needs to be with, please call.

## 2021-04-25 NOTE — Telephone Encounter (Signed)
Made in error, please disregard.

## 2021-04-25 NOTE — Telephone Encounter (Signed)
LVM for return call. 

## 2021-05-06 ENCOUNTER — Ambulatory Visit: Payer: Medicare Other | Admitting: Cardiology

## 2021-05-06 ENCOUNTER — Other Ambulatory Visit: Payer: Self-pay

## 2021-05-06 ENCOUNTER — Encounter: Payer: Self-pay | Admitting: Cardiology

## 2021-05-06 VITALS — BP 130/72 | HR 75 | Temp 97.2°F | Resp 17 | Ht 66.0 in | Wt 164.0 lb

## 2021-05-06 DIAGNOSIS — G7111 Myotonic muscular dystrophy: Secondary | ICD-10-CM

## 2021-05-06 DIAGNOSIS — I10 Essential (primary) hypertension: Secondary | ICD-10-CM

## 2021-05-06 NOTE — Progress Notes (Signed)
Primary Physician/Referring:  Michael Boston, MD  Patient ID: [name] [name], female    DOB: 12-19-1955, 65 y.o.   MRN: [id number]  Chief Complaint  Patient presents with  . New Patient (Initial Visit)  . Hypertension    Ref by Cristie Hem, MD   HPI:    [name] [name]  is a 65 y.o. 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  She remains asymptomatic without dyspnea, cough, leg edema or chest pain or palpitations.  Her myotonic dystrophy is gradually getting worse, 3 years ago I had seen her walking with a cane, presently she is using a walker.    Past Medical History:  Diagnosis Date  . Chronic low back pain   . Elevated LFTs   . GERD (gastroesophageal reflux disease)   . Goiter   . Hearing loss   . Hypertension   . Myotonic dystrophy, type 2 ([name] [name]) 06/16/2020  . Pituitary tumor   . Tinnitus   . Vitamin B deficiency    Past Surgical History:  Procedure Laterality Date  . BACK SURGERY     Family History  Problem Relation Age of Onset  . Lupus Mother   . Heart attack Mother   . Heart attack Father     Social History   Tobacco Use  . Smoking status: Former Research scientist (life sciences)  . Smokeless tobacco: Never Used  . Tobacco comment: Stopped over 20 years  Substance Use Topics  . Alcohol use: Yes    Alcohol/week: 1.0 standard drink    Types: 1 Glasses of wine per week    Comment: Rare   Marital Status: Divorced  ROS  Review of Systems  Cardiovascular: Negative for chest pain, dyspnea on exertion and leg swelling.  Musculoskeletal: Positive for muscle weakness.  Gastrointestinal: Negative for melena.   Objective  Blood pressure 130/72, pulse 75, temperature (!) 97.2 F (36.2 C), temperature source Temporal, resp. rate 17, height 5\' 6"  (1.676 m), weight 164 lb (74.4 kg), SpO2 98 %. Body mass index is 26.47 kg/m.  Vitals with BMI 05/06/2021 06/16/2020  01/14/2018  Height 5\' 6"  5\' 6"  5\' 6"   Weight 164 lbs 169 lbs 6 oz 162 lbs  BMI 26.48 60.63 01.60  Systolic 109 323 557  Diastolic 72 79 74  Pulse 75 66 69     Physical Exam Constitutional:      Appearance: She is normal weight.  Neck:     Vascular: No carotid bruit or JVD.  Cardiovascular:     Rate and Rhythm: Normal rate and regular rhythm.     Pulses: Intact distal pulses.     Heart sounds: Normal heart sounds. No murmur heard. No gallop.   Pulmonary:     Effort: Pulmonary effort is normal.     Breath sounds: Normal breath sounds.  Abdominal:     General: Bowel sounds are normal.     Palpations: Abdomen is soft.  Musculoskeletal:        General: No swelling.  Neurological:     Mental Status: She is alert.    Laboratory examination:    Medications and allergies  No Known Allergies    Medication prior to this encounter:   Outpatient Medications Prior to Visit  Medication Sig Dispense Refill  . ALPRAZolam (XANAX) 0.5 MG tablet Take 0.5 mg by mouth at bedtime as needed for anxiety.    Marland Kitchen b  complex vitamins capsule Take 1 capsule by mouth daily.    . Bacillus Coagulans-Inulin (ALIGN PREBIOTIC-PROBIOTIC PO) Take by mouth.    . calcium-vitamin D 250-100 MG-UNIT per tablet Take 1 tablet by mouth 2 (two) times daily.    . cetirizine (ZYRTEC) 10 MG tablet Take 10 mg by mouth daily.    . Cobalamin Combinations (NEURIVA PLUS) CAPS Take 1 capsule by mouth daily.    . Collagen-Vitamin C-Biotin (COLLAGEN 1500/C PO) Take by mouth.    . cyanocobalamin 1000 MCG tablet Take by mouth.    Marland Kitchen HYDROcodone-acetaminophen (NORCO/VICODIN) 5-325 MG tablet Take 1 tablet by mouth every 4 (four) hours as needed.     Marland Kitchen losartan-hydrochlorothiazide (HYZAAR) 100-12.5 MG tablet Take 1 tablet by mouth daily.    . Multiple Vitamin (MULTI-VITAMIN) tablet Take by mouth.    . NON FORMULARY Take 1-4 capsules by mouth as needed. TERRA ZYME    . traMADol (ULTRAM) 50 MG tablet Take by mouth every 6 (six)  hours as needed. Take two twice a day    . Turmeric 450 MG CAPS Take 1 capsule by mouth daily.    Marland Kitchen zolpidem (AMBIEN) 10 MG tablet Take 10 mg by mouth at bedtime as needed for sleep.    Marland Kitchen losartan (COZAAR) 100 MG tablet Take 100 mg by mouth daily.  3  . esomeprazole (NEXIUM) 10 MG packet Take by mouth.     No facility-administered medications prior to visit.     FINAL MEDICATION AS OF TODAY:   Current Meds  Medication Sig  . ALPRAZolam (XANAX) 0.5 MG tablet Take 0.5 mg by mouth at bedtime as needed for anxiety.  Marland Kitchen b complex vitamins capsule Take 1 capsule by mouth daily.  . Bacillus Coagulans-Inulin (ALIGN PREBIOTIC-PROBIOTIC PO) Take by mouth.  . calcium-vitamin D 250-100 MG-UNIT per tablet Take 1 tablet by mouth 2 (two) times daily.  . cetirizine (ZYRTEC) 10 MG tablet Take 10 mg by mouth daily.  . Cobalamin Combinations (NEURIVA PLUS) CAPS Take 1 capsule by mouth daily.  . Collagen-Vitamin C-Biotin (COLLAGEN 1500/C PO) Take by mouth.  . cyanocobalamin 1000 MCG tablet Take by mouth.  Marland Kitchen HYDROcodone-acetaminophen (NORCO/VICODIN) 5-325 MG tablet Take 1 tablet by mouth every 4 (four) hours as needed.   Marland Kitchen losartan-hydrochlorothiazide (HYZAAR) 100-12.5 MG tablet Take 1 tablet by mouth daily.  . Multiple Vitamin (MULTI-VITAMIN) tablet Take by mouth.  . NON FORMULARY Take 1-4 capsules by mouth as needed. TERRA ZYME  . traMADol (ULTRAM) 50 MG tablet Take by mouth every 6 (six) hours as needed. Take two twice a day  . Turmeric 450 MG CAPS Take 1 capsule by mouth daily.  Marland Kitchen zolpidem (AMBIEN) 10 MG tablet Take 10 mg by mouth at bedtime as needed for sleep.  . [DISCONTINUED] losartan (COZAAR) 100 MG tablet Take 100 mg by mouth daily.    Radiology:   No results found.  Cardiac Studies:   Echocardiogram  [02/01/2017]:  Left ventricle cavity is normal in size. Normal global wall motion. Normal diastolic filling pattern, normal LAP. Calculated EF 59%. Mild (Grade I) mitral regurgitation. Mild  to moderate tricuspid regurgitation. No evidence of pulmonary hypertension. Pulmonary artery systolic pressure is estimated at 27 mm Hg  EKG:      EKG 05/06/2021: Normal sinus rhythm with rate of 64 bpm, normal axis, poor R wave progression, cannot exclude anteroseptal infarct 4.  No evidence of ischemia.    Assessment     ICD-10-CM   1. Hypertension, unspecified type  I10 EKG 12-Lead    PCV ECHOCARDIOGRAM COMPLETE  2. Myotonic dystrophy, type 2 ([place])  G71.11 PCV ECHOCARDIOGRAM COMPLETE     Medications Discontinued During This Encounter  Medication Reason  . losartan (COZAAR) 100 MG tablet   . esomeprazole (NEXIUM) 10 MG packet Error    No orders of the defined types were placed in this encounter.  Orders Placed This Encounter  Procedures  . EKG 12-Lead  . PCV ECHOCARDIOGRAM COMPLETE    Standing Status:   Future    Standing Expiration Date:   05/06/2022   Recommendations:   Desta Bujak Schnake is a 65 y.o. 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  I had seen her 3 years ago.  She remains asymptomatic without dyspnea, cough, leg edema or chest pain or palpitations.  Her myotonic dystrophy is gradually getting worse, 3 years ago I had seen her walking with a cane, presently she is using a walker.  EKG essentially revealing poor R wave progression which is probably normal for her and there is no clinical evidence of heart failure.  Blood pressures well controlled.  I will obtain an echocardiogram and unless abnormal I will see her back in 2 years for follow-up.    Adrian Prows, MD, [facility] 05/06/2021, 2:23 PM Office: [phone]

## 2021-05-19 ENCOUNTER — Ambulatory Visit: Payer: Medicare Other

## 2021-05-19 ENCOUNTER — Other Ambulatory Visit: Payer: Self-pay

## 2021-05-19 DIAGNOSIS — I10 Essential (primary) hypertension: Secondary | ICD-10-CM

## 2021-05-19 DIAGNOSIS — G7111 Myotonic muscular dystrophy: Secondary | ICD-10-CM

## 2021-05-22 NOTE — Progress Notes (Signed)
Echocardiogram 05/19/2021: Normal LV systolic function with visual EF 60-65%. Left ventricle cavity is normal in size. Normal global wall motion. Normal diastolic filling pattern, normal LAP. Mild (Grade I) mitral regurgitation. Mild tricuspid regurgitation. No evidence of pulmonary hypertension. Compared to study dated 02/01/2017 no significant change.  NOrmal and will discuss on OV soon

## 2021-07-18 ENCOUNTER — Ambulatory Visit: Payer: Medicare Other | Admitting: Diagnostic Neuroimaging

## 2021-07-21 ENCOUNTER — Other Ambulatory Visit: Payer: Self-pay

## 2021-07-21 ENCOUNTER — Encounter: Payer: Self-pay | Admitting: Neurology

## 2021-07-21 ENCOUNTER — Ambulatory Visit (INDEPENDENT_AMBULATORY_CARE_PROVIDER_SITE_OTHER): Payer: Medicare Other | Admitting: Neurology

## 2021-07-21 VITALS — BP 124/77 | HR 65 | Ht 66.0 in | Wt 170.0 lb

## 2021-07-21 DIAGNOSIS — G7111 Myotonic muscular dystrophy: Secondary | ICD-10-CM | POA: Diagnosis not present

## 2021-07-21 DIAGNOSIS — D352 Benign neoplasm of pituitary gland: Secondary | ICD-10-CM | POA: Insufficient documentation

## 2021-07-21 DIAGNOSIS — H903 Sensorineural hearing loss, bilateral: Secondary | ICD-10-CM

## 2021-07-21 NOTE — Progress Notes (Signed)
Chief Complaint  Patient presents with   New Patient (Initial Visit)    Rm 16, alone here to establish care, states she is stable, no new questions or concerns,        ASSESSMENT AND PLAN  Crisp is a 65 y.o. female   Myotonic muscular dystrophy type II  Progressive weakness, now moderate bilateral lower extremity proximal muscle weakness, gait abnormalities.  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  Laboratory evaluations in November 2020: CBC, hemoglobin of 15.5, BMP, creatinine of 0.34, lipid panel, LDL of 93, normal TSH 1.5,  ECHO in June 2022: 1. Normal LV systolic function with visual EF 60-65%. Left ventricle cavity is normal in size. Normal global wall motion. Normal diastolic filling pattern, normal LAP. 2. Mild (Grade I) mitral regurgitation. 3. Mild tricuspid regurgitation. No evidence of pulmonary hypertension. 4. Compared to study dated 02/01/2017 no significant change.   EMG evaluation on the right arm and right leg shows diffuse proximal and distal mild myotonic discharges. Of note, on clinical examination, there is a delay in relaxation with grip on both hands, with repeated gripping, the stiffness improves. Given the EMG and clinical findings, possibility of a myotonic muscle disorder such as myotonia congenita (Thompson's disease) needs to be considered. Myotonic dystrophy is likely less likely.The patient reports no temperature sensitivity with the muscle stiffness, making paramyotonia congenita less likely.   MEDICAL HISTORY:  [name] [name] is a 65 year old female, to continue neurologic care for myotonic muscular dystrophy  type II, her primary care physician is Dr. Jacalyn Lefevre, Jesse Sans, MD, she has been a patient of Dr. Jannifer Franklin for many years, last visit was in July 2021  I reviewed and summarized the referring note.  Past medical history Hypertension Chronic insomnia Anxiety, xanax prn.  Around age 3s, she began to have difficulty climbing up stairs, gradually getting worse, eventually was diagnosed with myotonic muscular dystrophy type II based on abnormal EMG findings,  EMG in May 2016 by Dr. Jannifer Franklin, described myotonic discharges,   Per patient and Dr. Jannifer Franklin record, genetic testing was done and confirmed the presence of myotonic dystrophy type II,   She complains of diffuse body achy pain, taking tramadol '50mg'$  bid as needed, worsening gait abnormalities, used cane for a while, use walker since 2020, still driving.   MRI brain w/wo in Jan 2012  Stable 3 x 5 x 7 mm area of differential enhancement in the left posterior paramedian pituitary consistent with a small microadenoma or pars intermedia cyst.  Otherwise negative brain.  MRI scan of the cervical spine in April 2016 spondylitic changes from C4-C7 resulting in mild canal narrowing  and bilateral foraminal narrowing most severe at C6-7 but without  any cord signal abnormality.      PHYSICAL EXAM:   Vitals:   07/21/21 0833  BP: 124/77  Pulse: 65  Weight: 170 lb (77.1 kg)  Height: '5\' 6"'$  (1.676 m)   Not recorded     Body mass index is 27.44 kg/m.  PHYSICAL EXAMNIATION:  Gen: NAD, conversant, well nourised, well groomed         NEUROLOGICAL EXAM:  MENTAL STATUS: Speech/Cognition: Awake, alert, oriented to history taking and casual  conversation.  CRANIAL NERVES: CN II: Visual fields are full to confrontation. Pupils are round equal and briskly reactive to light. CN III, IV, VI: extraocular movement are normal. No ptosis. CN V: Facial sensation is intact to light touch CN VII: Face is symmetric with normal eye closure  CN VIII: Decreased  hearing bilaterally, Air Conduction more than bony conduction CN IX, X: Phonation is normal. CN XI: Head turning and shoulder shrug are intact  MOTOR: UE Shoulder Abduction Shoulder External Rotation Elbow Flexion Elbow  Extension Pronation Supination Wrist Flexion Wrist Extension Grip Finger  Abduction Finger Flexion /Extension  R 5- 5- '5 5 5 5 5 5 5 5 '$ 5/5  L 5- 5- '5 5 5 5 5 5 5 5 '$ 5/5   LE Hip Flexion Knee flexion Knee extension Ankle Dorsiflexion Eversion Ankle plantar Flexion Inversion Toe flexion/Extension  R 4- '5 5 5 5 5 5 '$ 5/5  L '4 5 5 5 5 5 5 '$ 5/5     REFLEXES: Reflexes are 1 and symmetric at the biceps, triceps, knees, and ankles. Plantar responses are flexor.  SENSORY: Intact to light touch, pinprick and vibratory sensation are intact in fingers and toes.  COORDINATION: There is no trunk or limb dysmetria noted.  GAIT/STANCE: She needs push-up to get up from sitting position, rely on her walker, bending forward, unsteady  REVIEW OF SYSTEMS:  Full 14 system review of systems performed and notable only for as above All other review of systems were negative.   ALLERGIES: No Known Allergies  HOME MEDICATIONS: Current Outpatient Medications  Medication Sig Dispense Refill   ALPRAZolam (XANAX) 0.5 MG tablet Take 0.5 mg by mouth at bedtime as needed for anxiety.     b complex vitamins capsule Take 1 capsule by mouth daily.     Bacillus Coagulans-Inulin (ALIGN PREBIOTIC-PROBIOTIC PO) Take by mouth.     calcium-vitamin D 250-100 MG-UNIT per tablet Take 1 tablet by mouth 2 (two) times daily.     cetirizine (ZYRTEC) 10 MG tablet Take 10 mg by mouth daily.     Cobalamin Combinations (NEURIVA PLUS) CAPS Take 1 capsule by mouth daily.     Collagen-Vitamin C-Biotin (COLLAGEN 1500/C PO) Take by mouth.     cyanocobalamin 1000 MCG tablet Take by mouth.     HYDROcodone-acetaminophen (NORCO/VICODIN) 5-325 MG tablet Take 1 tablet by mouth every 4 (four) hours as needed.       losartan-hydrochlorothiazide (HYZAAR) 100-12.5 MG tablet Take 1 tablet by mouth daily.     Multiple Vitamin (MULTI-VITAMIN) tablet Take by mouth.     NON FORMULARY Take 1-4 capsules by mouth as needed. TERRA ZYME     traMADol (ULTRAM) 50 MG tablet Take by mouth every 6 (six) hours as needed. Take two twice a day     Turmeric 450 MG CAPS Take 1 capsule by mouth daily.     zolpidem (AMBIEN) 10 MG tablet Take 10 mg by mouth at bedtime as needed for sleep.     No current facility-administered medications for this visit.    PAST MEDICAL HISTORY: Past Medical History:  Diagnosis Date   Chronic low back pain    Elevated LFTs    GERD (gastroesophageal reflux disease)    Goiter    Hearing loss    Hypertension    Myotonic dystrophy, type 2 ([place]) 06/16/2020   Pituitary tumor    Tinnitus    Vitamin B deficiency     PAST SURGICAL HISTORY: Past Surgical History:  Procedure Laterality  Date   BACK SURGERY      FAMILY HISTORY: Family History  Problem Relation Age of Onset   Lupus Mother    Heart attack Mother    Heart attack Father     SOCIAL HISTORY: Social History   Socioeconomic History   Marital status: Divorced    Spouse name: Not on file   Number of children: 1   Years of education: Some colg   Highest education level: Not on file  Occupational History   Occupation: Unemployeed  Tobacco Use   Smoking status: Former   Smokeless tobacco: Never   Tobacco comments:    Stopped over 20 years  Substance and Sexual Activity   Alcohol use: Yes    Alcohol/week: 1.0 standard drink    Types: 1 Glasses of wine per week    Comment: Rare   Drug use: No   Sexual activity: Not on file  Other Topics Concern   Not on file  Social History Narrative   1 caffeine drink a day   Patient is right handed.    Social Determinants of Health   Financial Resource Strain: Not on file  Food Insecurity: Not on file  Transportation Needs: Not on file  Physical Activity: Not on file  Stress:  Not on file  Social Connections: Not on file  Intimate Partner Violence: Not on file      Marcial Pacas, M.D. Ph.D.  [facility] [address], [place], [place] [address] Ph: [phone] Fax: [phone]  CC:  Michael Boston, MD [place],  [place] [address]  Jacalyn Lefevre Jesse Sans, MD

## 2021-07-29 ENCOUNTER — Ambulatory Visit
Admission: RE | Admit: 2021-07-29 | Discharge: 2021-07-29 | Disposition: A | Discharge status: Home / Self Care | Payer: Medicare Other | Source: Ambulatory Visit | Attending: Neurology | Admitting: Neurology

## 2021-07-29 ENCOUNTER — Other Ambulatory Visit: Payer: Self-pay

## 2021-07-29 DIAGNOSIS — H903 Sensorineural hearing loss, bilateral: Secondary | ICD-10-CM

## 2021-07-29 MED ORDER — GADOBENATE DIMEGLUMINE 529 MG/ML IV SOLN
7.0000 mL | Freq: Once | INTRAVENOUS | Status: AC | PRN
  Administered 2021-07-29: 7 mL via INTRAVENOUS

## 2021-08-01 ENCOUNTER — Telehealth: Payer: Self-pay | Admitting: Neurology

## 2021-08-01 NOTE — Telephone Encounter (Signed)
IMPRESSION: This MRI of the brain with and without contrast shows the following: 1.   T2/FLAIR hyperintense foci in the deep and subcortical white matter with confluencies in the periatrial white matter consistent with moderate chronic microvascular ischemic change, progressed compared to the 2012 MRI.  None of the foci appear to be acute.  They do not enhance. 2.   6 x 7 mm left posterior pituitary focus that is hyperintense on T1-weighted images, and has early enhancement on dynamic postcontrast and pituitary images.  Allowing for differences in technique and magnets, this is likely unchanged compared to the 2012 MRI.  The focus, stable for years, might be either a pituitary microadenoma or Rathke cleft cyst, though appearance is not classic for either. 3.   Elsewhere the brain has a normal enhancement pattern.  There were no acute findings.  Please call patient, MRI of brain showed moderate small vessel disease, stable posterior pituitary focus 6x55m.  She may take Asa '81mg'$  daily

## 2021-08-01 NOTE — Telephone Encounter (Signed)
Pt returned phone call, would like a call back.  

## 2021-08-01 NOTE — Telephone Encounter (Signed)
I spoke to the patient. She verbalized understanding of her MRI findings and will start taking aspirin '81mg'$ , one tablet daily.

## 2021-08-01 NOTE — Telephone Encounter (Signed)
Left message for a return call to discuss results. She may speak to anyone in POD 2.

## 2021-08-21 ENCOUNTER — Emergency Department (HOSPITAL_COMMUNITY): Payer: Medicare Other

## 2021-08-21 ENCOUNTER — Emergency Department (HOSPITAL_COMMUNITY)
Admission: EM | Admit: 2021-08-21 | Discharge: 2021-08-21 | Disposition: A | Discharge status: Home / Self Care | Payer: Medicare Other | Attending: Emergency Medicine | Admitting: Emergency Medicine

## 2021-08-21 ENCOUNTER — Other Ambulatory Visit: Payer: Self-pay

## 2021-08-21 ENCOUNTER — Encounter (HOSPITAL_COMMUNITY): Payer: Self-pay | Admitting: Emergency Medicine

## 2021-08-21 DIAGNOSIS — Z87891 Personal history of nicotine dependence: Secondary | ICD-10-CM | POA: Insufficient documentation

## 2021-08-21 DIAGNOSIS — Z20822 Contact with and (suspected) exposure to covid-19: Secondary | ICD-10-CM | POA: Insufficient documentation

## 2021-08-21 DIAGNOSIS — R0602 Shortness of breath: Secondary | ICD-10-CM | POA: Diagnosis not present

## 2021-08-21 DIAGNOSIS — R0789 Other chest pain: Secondary | ICD-10-CM | POA: Diagnosis present

## 2021-08-21 DIAGNOSIS — I10 Essential (primary) hypertension: Secondary | ICD-10-CM | POA: Insufficient documentation

## 2021-08-21 LAB — RESP PANEL BY RT-PCR (FLU A&B, COVID) ARPGX2
Influenza A by PCR: NEGATIVE
Influenza B by PCR: NEGATIVE
SARS Coronavirus 2 by RT PCR: NEGATIVE

## 2021-08-21 LAB — BASIC METABOLIC PANEL
Anion gap: 8 (ref 5–15)
BUN: 17 mg/dL (ref 8–23)
CO2: 28 mmol/L (ref 22–32)
Calcium: 9.6 mg/dL (ref 8.9–10.3)
Chloride: 102 mmol/L (ref 98–111)
Creatinine, Ser: 0.53 mg/dL (ref 0.44–1.00)
GFR, Estimated: >60 mL/min (ref >60)
Glucose, Bld: 101 mg/dL — ABNORMAL HIGH (ref 70–99)
Potassium: 4.8 mmol/L (ref 3.5–5.1)
Sodium: 138 mmol/L (ref 135–145)

## 2021-08-21 LAB — CBC
HCT: 47.9 % — ABNORMAL HIGH (ref 36.0–46.0)
Hemoglobin: 15.6 g/dL — ABNORMAL HIGH (ref 12.0–15.0)
MCH: 29.2 pg (ref 26.0–34.0)
MCHC: 32.6 g/dL (ref 30.0–36.0)
MCV: 89.5 fL (ref 80.0–100.0)
Platelets: 135 10*3/uL — ABNORMAL LOW (ref 150–400)
RBC: 5.35 MIL/uL — ABNORMAL HIGH (ref 3.87–5.11)
RDW: 15.3 % (ref 11.5–15.5)
WBC: 4.9 10*3/uL (ref 4.0–10.5)
nRBC: 0 % (ref 0.0–0.2)

## 2021-08-21 LAB — HEPATIC FUNCTION PANEL
ALT: 59 U/L — ABNORMAL HIGH (ref 0–44)
AST: 74 U/L — ABNORMAL HIGH (ref 15–41)
Albumin: 4.2 g/dL (ref 3.5–5.0)
Alkaline Phosphatase: 118 U/L (ref 38–126)
Bilirubin, Direct: 0.6 mg/dL — ABNORMAL HIGH (ref 0.0–0.2)
Indirect Bilirubin: 1.2 mg/dL — ABNORMAL HIGH (ref 0.3–0.9)
Total Bilirubin: 1.8 mg/dL — ABNORMAL HIGH (ref 0.3–1.2)
Total Protein: 6.8 g/dL (ref 6.5–8.1)

## 2021-08-21 LAB — TROPONIN I (HIGH SENSITIVITY)
Troponin I (High Sensitivity): 9 ng/L (ref <18)
Troponin I (High Sensitivity): 9 ng/L (ref <18)

## 2021-08-21 LAB — LIPASE, BLOOD: Lipase: 26 U/L (ref 11–51)

## 2021-08-21 LAB — D-DIMER, QUANTITATIVE: D-Dimer, Quant: 5.32 ug/mL-FEU — ABNORMAL HIGH (ref 0.00–0.50)

## 2021-08-21 LAB — BRAIN NATRIURETIC PEPTIDE: B Natriuretic Peptide: 18.2 pg/mL (ref 0.0–100.0)

## 2021-08-21 MED ORDER — IOHEXOL 350 MG/ML SOLN
50.0000 mL | Freq: Once | INTRAVENOUS | Status: AC | PRN
  Administered 2021-08-21: 50 mL via INTRAVENOUS

## 2021-08-21 NOTE — Discharge Instructions (Addendum)
Your laboratory results are within normal limits today.  We did obtain a CT angio of your chest, we discussed the results at length.  Please continue to follow-up with your primary care physician at your earliest convenience.

## 2021-08-21 NOTE — ED Triage Notes (Signed)
Pt reports intermittent SOB x 2-3 days.  States she had chest pain that woke her up this morning with inspiration but after she got up it resolved. Also reports nausea.

## 2021-08-21 NOTE — ED Provider Notes (Signed)
Emergency Department Provider Note   I have reviewed the triage vital signs and the nursing notes.   HISTORY  Chief Complaint Shortness of Breath   HPI [name] [name] is a 65 y.o. female with past medical history reviewed below presents to the emergency department for evaluation of intermittent trouble breathing for the past 2 to 3 days.  She woke up with some chest discomfort worse with deep breathing but states that has not lingered.  She is no longer having any chest pain but intermittently is feeling short of breath.  No fever/chills.  No cough. No prior history of PE or DVT.    Past Medical History:  Diagnosis Date   Chronic low back pain    Elevated LFTs    GERD (gastroesophageal reflux disease)    Goiter    Hearing loss    Hypertension    Myotonic dystrophy, type 2 ([place]) 06/16/2020   Pituitary tumor    Tinnitus    Vitamin B deficiency     Patient Active Problem List   Diagnosis Date Noted   Pituitary adenoma ([place]) 07/21/2021   Sensorineural hearing loss (SNHL) of both ears 07/21/2021   Myotonic dystrophy, type 2 ([place]) 06/16/2020   Myotonia 04/28/2015   Proximal limb muscle weakness 04/12/2015   HYPOTHYROIDISM 04/11/2008   ANXIETY DEPRESSION 04/11/2008   HYPERTENSION 04/11/2008   ALLERGIC RHINITIS 04/11/2008   GERD 04/11/2008   FATTY LIVER DISEASE 04/11/2008   ARTHRITIS 04/11/2008    Past Surgical History:  Procedure Laterality Date   BACK SURGERY      Allergies Patient has no known allergies.  Family History  Problem Relation Age of Onset   Lupus Mother    Heart attack Mother    Heart attack Father     Social History Social History   Tobacco Use   Smoking status: Former   Smokeless tobacco: Never   Tobacco comments:    Stopped over 20 years  Substance Use Topics   Alcohol use: Yes    Alcohol/week: 1.0 standard drink    Types: 1 Glasses of wine per week    Comment: Rare   Drug use: No    Review of Systems  Constitutional: No  fever/chills Eyes: No visual changes. ENT: No sore throat. Cardiovascular: Positive chest pain. Respiratory: Positive shortness of breath. Gastrointestinal: No abdominal pain.  No nausea, no vomiting.  No diarrhea.  No constipation. Genitourinary: Negative for dysuria. Musculoskeletal: Negative for back pain. Skin: Negative for rash. Neurological: Negative for headaches, focal weakness or numbness.  10-point ROS otherwise negative.  ____________________________________________   PHYSICAL EXAM:  VITAL SIGNS: ED Triage Vitals  Enc Vitals Group     BP 08/21/21 1322 131/75     Pulse Rate 08/21/21 1322 77     Resp 08/21/21 1322 16     Temp 08/21/21 1322 98.9 F (37.2 C)     Temp Source 08/21/21 1322 Oral     SpO2 08/21/21 1322 100 %   Constitutional: Alert and oriented. Well appearing and in no acute distress. Eyes: Conjunctivae are normal.  Head: Atraumatic. Nose: No congestion/rhinnorhea. Mouth/Throat: Mucous membranes are moist.   Neck: No stridor.  Cardiovascular: Normal rate, regular rhythm. Good peripheral circulation. Grossly normal heart sounds.   Respiratory: Normal respiratory effort.  No retractions. Lungs CTAB. Gastrointestinal: Soft and nontender. No distention.  Musculoskeletal: No lower extremity tenderness nor edema. No gross deformities of extremities. Neurologic:  Normal speech and language. No gross focal neurologic deficits are appreciated.  Skin:  Skin is warm, dry and intact. No rash noted.  ____________________________________________   LABS (all labs ordered are listed, but only abnormal results are displayed)  Labs Reviewed  BASIC METABOLIC PANEL - Abnormal; Notable for the following components:      Result Value   Glucose, Bld 101 (*)    All other components within normal limits  CBC - Abnormal; Notable for the following components:   RBC 5.35 (*)    Hemoglobin 15.6 (*)    HCT 47.9 (*)    Platelets 135 (*)    All other components within  normal limits  HEPATIC FUNCTION PANEL - Abnormal; Notable for the following components:   AST 74 (*)    ALT 59 (*)    Total Bilirubin 1.8 (*)    Bilirubin, Direct 0.6 (*)    Indirect Bilirubin 1.2 (*)    All other components within normal limits  D-DIMER, QUANTITATIVE - Abnormal; Notable for the following components:   D-Dimer, Quant 5.32 (*)    All other components within normal limits  RESP PANEL BY RT-PCR (FLU A&B, COVID) ARPGX2  BRAIN NATRIURETIC PEPTIDE  LIPASE, BLOOD  TROPONIN I (HIGH SENSITIVITY)  TROPONIN I (HIGH SENSITIVITY)   ____________________________________________  EKG   EKG Interpretation  Date/Time:  Sunday August 21 2021 13:19:32 EDT Ventricular Rate:  74 PR Interval:  120 QRS Duration: 116 QT Interval:  398 QTC Calculation: 441 R Axis:   -42 Text Interpretation: Normal sinus rhythm Left axis deviation Minimal voltage criteria for LVH, may be normal variant ( Cornell product ) Possible Anterior infarct , age undetermined Abnormal ECG No recent tracing for comparison Confirmed by Nanda Quinton [phone]) on 08/21/2021 6:11:59 PM        ____________________________________________  RADIOLOGY  DG Chest 2 View  Result Date: 08/21/2021 CLINICAL DATA:  Chest pain. EXAM: CHEST - 2 VIEW COMPARISON:  January 16, 2011 FINDINGS: Tortuosity of the aorta, with mild calcific atherosclerotic disease. Cardiomediastinal silhouette is normal. Mediastinal contours appear intact. There is no evidence of focal airspace consolidation, pleural effusion or pneumothorax. Osseous structures are without acute abnormality. Soft tissues are grossly normal. IMPRESSION: 1. No active cardiopulmonary disease. 2. Tortuosity of the aorta, with mild calcific atherosclerotic disease. Electronically Signed   By: Fidela Salisbury M.D.   On: 08/21/2021 14:53    ____________________________________________   PROCEDURES  Procedure(s) performed:   Procedures  None   ____________________________________________   INITIAL IMPRESSION / ASSESSMENT AND PLAN / ED COURSE  Pertinent labs & imaging results that were available during my care of the patient were reviewed by me and considered in my medical decision making (see chart for details).   Patient presents to the emergency department for evaluation of shortness of breath with some pleuritic chest pain earlier but no longer having discomfort.  Chest x-ray with no acute findings.  Labs are overall reassuring other than D-dimer which is elevated.   Differential includes all life-threatening causes for chest pain. This includes but is not exclusive to acute coronary syndrome, aortic dissection, pulmonary embolism, cardiac tamponade, community-acquired pneumonia, pericarditis, musculoskeletal chest wall pain, etc.  Labs reviewed. D-dimer elevated and will need CTA. Results pending. Care transferred to [facility] pending CTA. If negative, plan for d/c home with PCP follow up plan later this week.   ____________________________________________  FINAL CLINICAL IMPRESSION(S) / ED DIAGNOSES  Final diagnoses:  Atypical chest pain     MEDICATIONS GIVEN DURING THIS VISIT:  Medications  iohexol (OMNIPAQUE) 350 MG/ML injection 50  mL (50 mLs Intravenous Contrast Given 08/21/21 2110)       Note:  This document was prepared using Dragon voice recognition software and may include unintentional dictation errors.  Nanda Quinton, MD, [facility]    Mikailah Morel, [facility], MD 08/24/21 [phone]

## 2021-08-21 NOTE — ED Provider Notes (Signed)
  Physical Exam  BP (!) 142/84   Pulse 64   Temp 98.9 F (37.2 C) (Oral)   Resp 18   SpO2 100%   Physical Exam  ED Course/Procedures   Clinical Course as of 08/21/21 2202  Sun Aug 21, 2021  2117 D-Dimer, America Brown(!): 5.32 [JS]    Clinical Course User Index [JS] Janeece Fitting, PA-C    Procedures  MDM  Patient care assumed from Dr. Laverta Baltimore at shift change, please see his note for full HPI.  Briefly, patient here with intermittent trouble breathing for the past 2 to 3 days and pleuritic chest pain that has now resolved.  Work-up so far is benign.  X-ray without any acute findings.  Troponin x2 are negative.  D-dimer level did come back positive for 5.32.  Plan is for patient to have CTA to rule out pulmonary embolism.  If negative patient will be appropriate for outpatient disposition.  CT Angio showed:  No evidence of pulmonary embolism.     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     Suspected splenomegaly, incompletely visualized.     Aortic Atherosclerosis (ICD10-I70.0).   10:15 PM these results were discussed at length with patient.  She was provided with a copy of her CT report at this time.  She will follow-up with her primary care physician along with pulmonology on an outpatient basis.  Patient remains hemodynamically stable for discharge.  Portions of this note were generated with Lobbyist. Dictation errors may occur despite best attempts at proofreading.           Janeece Fitting, PA-C 08/21/21 2217    Margette Fast, MD 08/24/21 [phone]

## 2021-08-21 NOTE — ED Notes (Signed)
Patient transported to CT 

## 2021-08-24 ENCOUNTER — Encounter: Payer: Self-pay | Admitting: Gastroenterology

## 2021-08-24 ENCOUNTER — Telehealth: Payer: Self-pay | Admitting: Internal Medicine

## 2021-08-24 ENCOUNTER — Other Ambulatory Visit: Payer: Self-pay | Admitting: Internal Medicine

## 2021-08-24 DIAGNOSIS — R161 Splenomegaly, not elsewhere classified: Secondary | ICD-10-CM

## 2021-08-24 NOTE — Telephone Encounter (Signed)
Okay with me 

## 2021-08-24 NOTE — Telephone Encounter (Signed)
Hey Dr. Henrene Pastor this pt would like to switch providers from you to Dr. Candis Schatz. Pt was seen by you 10 years ago. Pt didn't give any specific reason why when asked. Please advise. Thank you.

## 2021-09-08 ENCOUNTER — Other Ambulatory Visit: Payer: Medicare Other

## 2021-09-21 ENCOUNTER — Ambulatory Visit: Payer: Medicare Other | Admitting: Gastroenterology

## 2021-09-22 ENCOUNTER — Other Ambulatory Visit: Payer: Self-pay

## 2021-09-22 ENCOUNTER — Ambulatory Visit (INDEPENDENT_AMBULATORY_CARE_PROVIDER_SITE_OTHER): Payer: Medicare Other | Admitting: Pulmonary Disease

## 2021-09-22 VITALS — BP 110/70 | HR 78 | Temp 98.4°F | Ht 66.0 in | Wt 170.8 lb

## 2021-09-22 DIAGNOSIS — R768 Other specified abnormal immunological findings in serum: Secondary | ICD-10-CM | POA: Diagnosis not present

## 2021-09-22 DIAGNOSIS — R0602 Shortness of breath: Secondary | ICD-10-CM | POA: Diagnosis not present

## 2021-09-22 DIAGNOSIS — J310 Chronic rhinitis: Secondary | ICD-10-CM

## 2021-09-22 MED ORDER — ANORO ELLIPTA 62.5-25 MCG/INH IN AEPB: 1.0000 | INHALATION_SPRAY | Freq: Every day | RESPIRATORY_TRACT | 0 refills | Status: DC

## 2021-09-22 NOTE — Progress Notes (Signed)
Patient seen in the office today and instructed on use of Anoro.  Patient expressed understanding and demonstrated technique. 

## 2021-09-22 NOTE — Progress Notes (Signed)
Synopsis: Referred in October 2022 for abdnormal CT scan by Claud Kelp, NP  Subjective:   PATIENT ID: [name] [name] Letts GENDER: female DOB: Aug 19, 1956, MRN: [id number]   HPI  Chief Complaint  Patient presents with   Consult   [name] is a 65 year old woman, former smoker with myotonic dystrophy and sleep apnea who is referred to pulmonary clinic for abnormal CT scan.   She had CT chest on  08/21/21 for chest pain which showed tree-in-bud nodularity in the left lung apex, right upper lobe and inferior right middle lobe and lingula.   She was treated for pneumonia 2 months ago with antibiotics and prednisone with improvement in her symptoms. She reports having covid last month. She denies any cough or wheezing currently. She denies fevers but reports having night sweats thought secondary to menopause. She denies any weight loss or loss of appetite.  She reports having chronic nasal drainage. She had nasal surgery when she was younger. She reports frequent sinus and pulmonary infections almost on a monthly basis. Her most recent dose of prednisone was a month ago.   She quit smoking 35 years ago.   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   Pituitary tumor    Tinnitus    Vitamin B deficiency      Family History  Problem Relation Age of Onset   Lupus Mother    Heart attack Mother    Heart attack Father      Social History   Socioeconomic History   Marital status: Divorced    Spouse name: Not on file   Number of children: 1   Years of education: Some colg   Highest education level: Not on file  Occupational History   Occupation: Unemployeed  Tobacco Use   Smoking status: Former   Smokeless tobacco: Never   Tobacco comments:    Stopped over 20 years  Substance and Sexual Activity   Alcohol  use: Yes    Alcohol/week: 1.0 standard drink    Types: 1 Glasses of wine per week    Comment: Rare   Drug use: No   Sexual activity: Not on file  Other Topics Concern   Not on file  Social History Narrative   1 caffeine drink a day   Patient is right handed.    Social Determinants of Health   Financial Resource Strain: Not on file  Food Insecurity: Not on file  Transportation Needs: Not on file  Physical Activity: Not on file  Stress: Not on file  Social Connections: Not on file  Intimate Partner Violence: Not on file     Allergies  Allergen Reactions   Doxycycline Shortness Of Breath     Outpatient Medications Prior to Visit  Medication Sig Dispense Refill   ALPRAZolam (XANAX) 0.5 MG tablet Take 0.5 mg by mouth at bedtime as needed for anxiety.     aspirin EC 81 MG tablet Take 81 mg by mouth daily. Swallow whole.     Bacillus Coagulans-Inulin (ALIGN PREBIOTIC-PROBIOTIC PO) Take by mouth.     calcium-vitamin D 250-100 MG-UNIT per tablet Take 1 tablet by mouth 2 (two) times daily.     cyanocobalamin 1000 MCG tablet Take by mouth.     losartan-hydrochlorothiazide (HYZAAR) 100-12.5  MG tablet Take 1 tablet by mouth daily.     Multiple Vitamin (MULTI-VITAMIN) tablet Take by mouth.     NON FORMULARY Take 1-4 capsules by mouth as needed. TERRA ZYME     traMADol (ULTRAM) 50 MG tablet Take by mouth every 6 (six) hours as needed. Take two twice a day     zolpidem (AMBIEN) 10 MG tablet Take 10 mg by mouth at bedtime as needed for sleep.     b complex vitamins capsule Take 1 capsule by mouth daily. (Patient not taking: Reported on 09/22/2021)     cetirizine (ZYRTEC) 10 MG tablet Take 10 mg by mouth daily. (Patient not taking: Reported on 09/22/2021)     Cobalamin Combinations (NEURIVA PLUS) CAPS Take 1 capsule by mouth daily. (Patient not taking: Reported on 09/22/2021)     Collagen-Vitamin C-Biotin (COLLAGEN 1500/C PO) Take by mouth. (Patient not taking: Reported on 09/22/2021)      HYDROcodone-acetaminophen (NORCO/VICODIN) 5-325 MG tablet Take 1 tablet by mouth every 4 (four) hours as needed.  (Patient not taking: Reported on 09/22/2021)     Turmeric 450 MG CAPS Take 1 capsule by mouth daily. (Patient not taking: Reported on 09/22/2021)     No facility-administered medications prior to visit.    Review of Systems  Constitutional:  Negative for chills, fever, malaise/fatigue and weight loss.  HENT:  Positive for congestion. Negative for sinus pain and sore throat.   Eyes: Negative.   Respiratory:  Positive for shortness of breath. Negative for cough, hemoptysis, sputum production and wheezing.   Cardiovascular:  Negative for chest pain, palpitations, orthopnea, claudication and leg swelling.  Gastrointestinal:  Positive for heartburn. Negative for abdominal pain, nausea and vomiting.  Genitourinary: Negative.   Musculoskeletal:  Positive for joint pain. Negative for myalgias.  Skin:  Negative for rash.  Neurological:  Negative for weakness.  Endo/Heme/Allergies: Negative.   Psychiatric/Behavioral:  The patient is nervous/anxious.      Objective:   Vitals:   09/22/21 1145  BP: 110/70  Pulse: 78  Temp: 98.4 F (36.9 C)  TempSrc: Oral  SpO2: 97%  Weight: 170 lb 12.8 oz (77.5 kg)  Height: _0  (1.676 m)     Physical Exam Constitutional:      General: She is not in acute distress.    Appearance: She is not ill-appearing.  HENT:     Head: Normocephalic and atraumatic.  Eyes:     General: No scleral icterus.    Conjunctiva/sclera: Conjunctivae normal.     Pupils: Pupils are equal, round, and reactive to light.  Cardiovascular:     Rate and Rhythm: Normal rate and regular rhythm.     Pulses: Normal pulses.     Heart sounds: Normal heart sounds. No murmur heard. Pulmonary:     Effort: Pulmonary effort is normal.     Breath sounds: Normal breath sounds. No wheezing, rhonchi or rales.  Musculoskeletal:     Right lower leg: No edema.     Left lower  leg: No edema.  Lymphadenopathy:     Cervical: No cervical adenopathy.  Skin:    General: Skin is warm and dry.  Neurological:     General: No focal deficit present.     Mental Status: She is alert.  Psychiatric:        Mood and Affect: Mood normal.        Behavior: Behavior normal.        Thought Content: Thought content normal.  Judgment: Judgment normal.    CBC    Component Value Date/Time   WBC 4.9 08/21/2021 1347   RBC 5.35 (H) 08/21/2021 1347   HGB 15.6 (H) 08/21/2021 1347   HCT 47.9 (H) 08/21/2021 1347   PLT 135 (L) 08/21/2021 1347   MCV 89.5 08/21/2021 1347   MCH 29.2 08/21/2021 1347   MCHC 32.6 08/21/2021 1347   RDW 15.3 08/21/2021 1347   LYMPHSABS 0.9 01/16/2011 1635   MONOABS 0.7 01/16/2011 1635   EOSABS 0.1 01/16/2011 1635   BASOSABS 0.0 01/16/2011 1635   Chest imaging: CTA Chest 08/21/21 No evidence of pulmonary embolism.   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   Suspected splenomegaly, incompletely visualized.   Aortic Atherosclerosis  PFT: PFT Results Latest Ref Rng & Units 07/13/2015  FVC-Pre L 3.35  FVC-Predicted Pre % 94  FVC-Post L 3.49  FVC-Predicted Post % 98  Pre FEV1/FVC % % 75  Post FEV1/FCV % % 77  FEV1-Pre L 2.52  FEV1-Predicted Pre % 91  FEV1-Post L 2.70  DLCO uncorrected ml/min/mmHg 22.64  DLCO UNC% % 83  DLVA Predicted % 95  TLC L 5.73  TLC % Predicted % 107  RV % Predicted % 115  PFT 2018: within normal limits.  Labs: 04/22/2021 AST 56, ALT 52, alk phos 164 CBC showed absolute eosinophil count 500, normal hemoglobin and low normal platelets 143,000  Path:  Echo 05/2021: LVEF 60 to 65%.  Normal diastolic filling pattern.  Mild grade 1 mitral regurgitation.  No significant change from 2018.  Heart Catheterization:  Assessment & Plan:   Chronic rhinitis - Plan: IgG, IgA, IgM, IgG, IgA, IgM  Shortness of breath - Plan:  Pulmonary Function Test, Antinuclear Antib (ANA), ANCA Screen Reflex Titer, ANCA Screen Reflex Titer, Antinuclear Antib (ANA)  Low immunoglobulin level - Plan: Ambulatory referral to Immunology  Discussion: [name] [name] is a 65 year old woman, former smoker with myotonic dystrophy and sleep apnea who is referred to pulmonary clinic for abnormal CT scan.   Her CT scan does show scattered tree-in-bud infiltrates but she remains fairly asymptomatic at this time without cough, fever and chills.  She has no signs of weight loss or loss of appetite.  We will continue to monitor for any changes in her clinical status.  She has history concerning for sinopulmonary disease in which we checked immunoglobulin levels.  She has low IgA and IgM immunoglobulin levels.  Her ANA is negative and p-ANCA titer is low positive.  We can consider checking MPO titers in the future.  We will refer her to immunology for further evaluation of her low immunoglobulin levels.  We will trial her on Anoro Ellipta 1 puff daily and monitor for any improvement in her shortness of breath.  We will check pulmonary function testing at her convenient and have follow up in 3 months.   Freda Jackson, MD [facility] Office: [phone]   Current Outpatient Medications:    ALPRAZolam (XANAX) 0.5 MG tablet, Take 0.5 mg by mouth at bedtime as needed for anxiety., Disp: , Rfl:    aspirin EC 81 MG tablet, Take 81 mg by mouth daily. Swallow whole., Disp: , Rfl:    Bacillus Coagulans-Inulin (ALIGN PREBIOTIC-PROBIOTIC PO), Take by mouth., Disp: , Rfl:    calcium-vitamin D 250-100 MG-UNIT per tablet, Take 1 tablet by mouth 2 (two) times daily., Disp: , Rfl:    cyanocobalamin 1000 MCG tablet, Take  by mouth., Disp: , Rfl:    losartan-hydrochlorothiazide (HYZAAR) 100-12.5 MG tablet, Take 1 tablet by mouth daily., Disp: , Rfl:    Multiple Vitamin (MULTI-VITAMIN) tablet, Take by mouth., Disp: , Rfl:    NON FORMULARY,  Take 1-4 capsules by mouth as needed. TERRA ZYME, Disp: , Rfl:    traMADol (ULTRAM) 50 MG tablet, Take by mouth every 6 (six) hours as needed. Take two twice a day, Disp: , Rfl:    umeclidinium-vilanterol (ANORO ELLIPTA) 62.5-25 MCG/INH AEPB, Inhale 1 puff into the lungs daily., Disp: 7 each, Rfl: 0   zolpidem (AMBIEN) 10 MG tablet, Take 10 mg by mouth at bedtime as needed for sleep., Disp: , Rfl:    b complex vitamins capsule, Take 1 capsule by mouth daily. (Patient not taking: Reported on 09/22/2021), Disp: , Rfl:    cetirizine (ZYRTEC) 10 MG tablet, Take 10 mg by mouth daily. (Patient not taking: Reported on 09/22/2021), Disp: , Rfl:    Cobalamin Combinations (NEURIVA PLUS) CAPS, Take 1 capsule by mouth daily. (Patient not taking: Reported on 09/22/2021), Disp: , Rfl:    Collagen-Vitamin C-Biotin (COLLAGEN 1500/C PO), Take by mouth. (Patient not taking: Reported on 09/22/2021), Disp: , Rfl:    HYDROcodone-acetaminophen (NORCO/VICODIN) 5-325 MG tablet, Take 1 tablet by mouth every 4 (four) hours as needed.  (Patient not taking: Reported on 09/22/2021), Disp: , Rfl:    Turmeric 450 MG CAPS, Take 1 capsule by mouth daily. (Patient not taking: Reported on 09/22/2021), Disp: , Rfl:

## 2021-09-22 NOTE — Patient Instructions (Signed)
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 Your symptoms do not appear significant at this time to pursue bronchoscopy for gather samples for cultures. We will continue to monitor.   You history is concerning for sino-pulmonary disease so we will check labs today.   We will try you on Anoro ellipta inhaler, 1 puff daily. Please let us know if you notice improvement in your shortness of breath.   We will further evaluate your lung function via pulmonary function tests at your convenience.

## 2021-09-23 ENCOUNTER — Ambulatory Visit
Admission: RE | Admit: 2021-09-23 | Discharge: 2021-09-23 | Disposition: A | Discharge status: Home / Self Care | Payer: Medicare Other | Source: Ambulatory Visit | Attending: Internal Medicine | Admitting: Internal Medicine

## 2021-09-23 DIAGNOSIS — R161 Splenomegaly, not elsewhere classified: Secondary | ICD-10-CM

## 2021-09-23 MED ORDER — IOPAMIDOL (ISOVUE-300) INJECTION 61%
100.0000 mL | Freq: Once | INTRAVENOUS | Status: AC | PRN
  Administered 2021-09-23: 100 mL via INTRAVENOUS

## 2021-09-25 LAB — ANA: Anti Nuclear Antibody (ANA): NEGATIVE

## 2021-09-25 LAB — IGG, IGA, IGM
IgG (Immunoglobin G), Serum: 960 mg/dL (ref 600–1540)
IgM, Serum: 34 mg/dL — ABNORMAL LOW (ref 50–300)
Immunoglobulin A: <5 mg/dL — ABNORMAL LOW (ref 70–320)

## 2021-09-25 LAB — ANCA SCREEN W REFLEX TITER
ANCA Screen: POSITIVE — AB
P-ANCA Titer: 1:20 {titer} — ABNORMAL HIGH

## 2021-09-30 ENCOUNTER — Telehealth: Payer: Self-pay | Admitting: Pulmonary Disease

## 2021-09-30 NOTE — Telephone Encounter (Signed)
Called and spoke with pt and she stated that she was given the samples of the Anoro to try.  She stated that the meds seem to help her but the side effect of the Anoro is making her very jittery and she cannot handle this.  She stated that she has been using it for a week and she is not any better.  JD please advise. Thanks

## 2021-10-05 ENCOUNTER — Encounter: Payer: Self-pay | Admitting: Pulmonary Disease

## 2021-10-05 NOTE — Telephone Encounter (Signed)
The LABA component is likely making her feel jittery.   Please send in prescription for spiriva 2.65mcg 2 puffs daily and see if this helps her breathing without the side effect of feeling jittery.  Thanks, Wille Glaser

## 2021-10-06 NOTE — Telephone Encounter (Signed)
Ok thanks 

## 2021-10-06 NOTE — Telephone Encounter (Signed)
I spoke with the pt and notified her of response from Dr Erin Fulling  She states she wants to hold off on any inhalers for now She found a home remedy that consists of ginger, water and honey that has been helping her breathing  She prefers to stick with this for now  FYI for Dr Erin Fulling- she is scheduled next for PFT's in Dec and OV with JD Jan 2023

## 2021-11-18 ENCOUNTER — Other Ambulatory Visit: Payer: Self-pay

## 2021-11-18 ENCOUNTER — Ambulatory Visit: Payer: Medicare Other

## 2021-11-24 ENCOUNTER — Other Ambulatory Visit: Payer: Self-pay | Admitting: Internal Medicine

## 2021-11-24 DIAGNOSIS — R590 Localized enlarged lymph nodes: Secondary | ICD-10-CM

## 2021-11-30 ENCOUNTER — Ambulatory Visit
Admission: RE | Admit: 2021-11-30 | Discharge: 2021-11-30 | Disposition: A | Discharge status: Home / Self Care | Payer: Medicare Other | Source: Ambulatory Visit | Attending: Internal Medicine | Admitting: Internal Medicine

## 2021-11-30 DIAGNOSIS — R590 Localized enlarged lymph nodes: Secondary | ICD-10-CM

## 2021-11-30 MED ORDER — IOPAMIDOL (ISOVUE-300) INJECTION 61%
75.0000 mL | Freq: Once | INTRAVENOUS | Status: AC | PRN
  Administered 2021-11-30: 11:00:00 75 mL via INTRAVENOUS

## 2021-12-15 ENCOUNTER — Ambulatory Visit: Payer: Medicare Other | Admitting: Allergy & Immunology

## 2021-12-15 ENCOUNTER — Other Ambulatory Visit: Payer: Self-pay

## 2021-12-15 ENCOUNTER — Ambulatory Visit (INDEPENDENT_AMBULATORY_CARE_PROVIDER_SITE_OTHER): Payer: Medicare Other | Admitting: Pulmonary Disease

## 2021-12-15 DIAGNOSIS — R0602 Shortness of breath: Secondary | ICD-10-CM | POA: Diagnosis not present

## 2021-12-15 LAB — PULMONARY FUNCTION TEST
DL/VA % pred: 123 %
DL/VA: 5.06 ml/min/mmHg/L
DLCO cor % pred: 98 %
DLCO cor: 20.8 ml/min/mmHg
DLCO unc % pred: 98 %
DLCO unc: 20.8 ml/min/mmHg
FEF 25-75 Post: 1.69 L/sec
FEF 25-75 Pre: 1.72 L/sec
FEF2575-%Change-Post: -2 %
FEF2575-%Pred-Post: 76 %
FEF2575-%Pred-Pre: 78 %
FEV1-%Change-Post: 0 %
FEV1-%Pred-Post: 79 %
FEV1-%Pred-Pre: 79 %
FEV1-Post: 2.05 L
FEV1-Pre: 2.04 L
FEV1FVC-%Change-Post: -3 %
FEV1FVC-%Pred-Pre: 100 %
FEV6-%Change-Post: 4 %
FEV6-%Pred-Post: 85 %
FEV6-%Pred-Pre: 81 %
FEV6-Post: 2.77 L
FEV6-Pre: 2.65 L
FEV6FVC-%Pred-Post: 104 %
FEV6FVC-%Pred-Pre: 104 %
FVC-%Change-Post: 4 %
FVC-%Pred-Post: 81 %
FVC-%Pred-Pre: 78 %
FVC-Post: 2.77 L
FVC-Pre: 2.65 L
Post FEV1/FVC ratio: 74 %
Post FEV6/FVC ratio: 100 %
Pre FEV1/FVC ratio: 77 %
Pre FEV6/FVC Ratio: 100 %
RV % pred: 101 %
RV: 2.23 L
TLC % pred: 95 %
TLC: 5.14 L

## 2021-12-15 NOTE — Progress Notes (Signed)
Full PFT performed today. °

## 2021-12-15 NOTE — Patient Instructions (Signed)
Full PFT performed today. °

## 2021-12-20 ENCOUNTER — Other Ambulatory Visit: Payer: Self-pay

## 2021-12-20 ENCOUNTER — Ambulatory Visit (INDEPENDENT_AMBULATORY_CARE_PROVIDER_SITE_OTHER): Payer: Medicare Other | Admitting: Pulmonary Disease

## 2021-12-20 ENCOUNTER — Encounter: Payer: Self-pay | Admitting: Pulmonary Disease

## 2021-12-20 VITALS — BP 116/72 | HR 65 | Temp 98.2°F | Ht 66.0 in | Wt 169.4 lb

## 2021-12-20 DIAGNOSIS — R9389 Abnormal findings on diagnostic imaging of other specified body structures: Secondary | ICD-10-CM | POA: Diagnosis not present

## 2021-12-20 DIAGNOSIS — R0602 Shortness of breath: Secondary | ICD-10-CM | POA: Diagnosis not present

## 2021-12-20 NOTE — Progress Notes (Signed)
Synopsis: Referred in October 2022 for abdnormal CT scan by Claud Kelp, NP  Subjective:   PATIENT ID: [name] [name] GENDER: female DOB: 1956-05-19, MRN: [id number]   HPI  Chief Complaint  Patient presents with   Follow-up    Breathing is doing well and has not had any cough or chest discomfort. She stopped Anoro after a wk due to feeling nervous after taking this.    [name] [name] is a 66 year old woman, former smoker with myotonic dystrophy and sleep apnea who returns to pulmonary clinic for abnormal CT scan and shortness of breath.   She has been doing well since last visit. She reports her shortness of breath has resolved. She was not able to tolerate Anoro ellipta due to feeling jittery. She has found a natural tea remedy which includes ginger, honey and tea that has helped her breathing and sinuses. She has stopped taking daily allergy medicine as well. She does complain of some sinus congestion and post-nasal drainage.  Her PFTs are within normal limits today but do show mildly reduced FEV1. Otherwise normal TLC and DLCO.   She has up coming evaluation with allergy/immunology for low IgA level.  OV 09/22/21 She had CT chest on  08/21/21 for chest pain which showed tree-in-bud nodularity in the left lung apex, right upper lobe and inferior right middle lobe and lingula.   She was treated for pneumonia 2 months ago with antibiotics and prednisone with improvement in her symptoms. She reports having covid last month. She denies any cough or wheezing currently. She denies fevers but reports having night sweats thought secondary to menopause. She denies any weight loss or loss of appetite.  She reports having chronic nasal drainage. She had nasal surgery when she was younger. She reports frequent sinus and pulmonary infections almost on a monthly basis. Her most recent dose of prednisone was a month ago.   She quit smoking 35 years ago.   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   Pituitary tumor    Tinnitus    Vitamin B deficiency      Family History  Problem Relation Age of Onset   Lupus Mother    Heart attack Mother    Heart attack Father      Social History   Socioeconomic History   Marital status: Divorced    Spouse name: Not on file   Number of children: 1   Years of education: Some colg   Highest education level: Not on file  Occupational History   Occupation: Unemployeed  Tobacco Use   Smoking status: Former    Packs/day: 0.50    Years: 20.00    Pack years: 10.00    Types: Cigarettes    Quit date: 12/11/1988    Years since quitting: 33.0   Smokeless tobacco: Never  Substance and Sexual Activity   Alcohol use: Yes    Alcohol/week: 1.0 standard drink    Types: 1 Glasses of wine per week    Comment: Rare   Drug use: No   Sexual activity: Not on file  Other Topics Concern   Not on file  Social History Narrative   1 caffeine drink a day   Patient is right handed.    Social  Determinants of Health   Financial Resource Strain: Not on file  Food Insecurity: Not on file  Transportation Needs: Not on file  Physical Activity: Not on file  Stress: Not on file  Social Connections: Not on file  Intimate Partner Violence: Not on file     Allergies  Allergen Reactions   Doxycycline Shortness Of Breath     Outpatient Medications Prior to Visit  Medication Sig Dispense Refill   ALPRAZolam (XANAX) 0.5 MG tablet Take 0.5 mg by mouth at bedtime as needed for anxiety.     aspirin EC 81 MG tablet Take 81 mg by mouth daily. Swallow whole.     b complex vitamins capsule Take 1 capsule by mouth daily.     Bacillus Coagulans-Inulin (ALIGN PREBIOTIC-PROBIOTIC PO) Take by mouth.     calcium-vitamin D 250-100 MG-UNIT per tablet Take 1 tablet by mouth 2 (two) times daily.      cetirizine (ZYRTEC) 10 MG tablet Take 10 mg by mouth daily.     Cobalamin Combinations (NEURIVA PLUS) CAPS Take 1 capsule by mouth daily.     Collagen-Vitamin C-Biotin (COLLAGEN 1500/C PO) Take by mouth.     cyanocobalamin 1000 MCG tablet Take by mouth.     losartan-hydrochlorothiazide (HYZAAR) 100-12.5 MG tablet Take 1 tablet by mouth daily.     Multiple Vitamin (MULTI-VITAMIN) tablet Take by mouth.     NON FORMULARY Take 1-4 capsules by mouth as needed. TERRA ZYME     traMADol (ULTRAM) 50 MG tablet Take by mouth every 6 (six) hours as needed. Take two twice a day     Turmeric 450 MG CAPS Take 1 capsule by mouth daily.     zolpidem (AMBIEN) 10 MG tablet Take 10 mg by mouth at bedtime as needed for sleep.     HYDROcodone-acetaminophen (NORCO/VICODIN) 5-325 MG tablet Take 1 tablet by mouth every 4 (four) hours as needed.     umeclidinium-vilanterol (ANORO ELLIPTA) 62.5-25 MCG/INH AEPB Inhale 1 puff into the lungs daily. 7 each 0   No facility-administered medications prior to visit.    Review of Systems  Constitutional:  Negative for chills, fever, malaise/fatigue and weight loss.  HENT:  Negative for congestion, sinus pain and sore throat.   Eyes: Negative.   Respiratory:  Negative for cough, hemoptysis, sputum production, shortness of breath and wheezing.   Cardiovascular:  Negative for chest pain, palpitations, orthopnea, claudication and leg swelling.  Gastrointestinal:  Negative for abdominal pain, heartburn, nausea and vomiting.  Genitourinary: Negative.   Musculoskeletal:  Negative for joint pain and myalgias.  Skin:  Negative for rash.  Neurological:  Negative for weakness.  Endo/Heme/Allergies: Negative.   Psychiatric/Behavioral:  The patient is not nervous/anxious.      Objective:   Vitals:   12/20/21 1117  BP: 116/72  Pulse: 65  Temp: 98.2 F (36.8 C)  TempSrc: Oral  SpO2: 99%  Weight: 169 lb 6.4 oz (76.8 kg)  Height: 5' 6" (1.676 m)     Physical  Exam Constitutional:      General: She is not in acute distress.    Appearance: She is not ill-appearing.  HENT:     Head: Normocephalic and atraumatic.  Eyes:     General: No scleral icterus.    Conjunctiva/sclera: Conjunctivae normal.  Cardiovascular:     Rate and Rhythm: Normal rate and regular rhythm.     Pulses: Normal pulses.     Heart sounds: Normal heart sounds. No murmur heard. Pulmonary:  Effort: Pulmonary effort is normal.     Breath sounds: Normal breath sounds. No wheezing, rhonchi or rales.  Musculoskeletal:     Right lower leg: No edema.     Left lower leg: No edema.  Skin:    General: Skin is warm and dry.  Neurological:     General: No focal deficit present.     Mental Status: She is alert.  Psychiatric:        Mood and Affect: Mood normal.        Behavior: Behavior normal.        Thought Content: Thought content normal.        Judgment: Judgment normal.    CBC    Component Value Date/Time   WBC 4.9 08/21/2021 1347   RBC 5.35 (H) 08/21/2021 1347   HGB 15.6 (H) 08/21/2021 1347   HCT 47.9 (H) 08/21/2021 1347   PLT 135 (L) 08/21/2021 1347   MCV 89.5 08/21/2021 1347   MCH 29.2 08/21/2021 1347   MCHC 32.6 08/21/2021 1347   RDW 15.3 08/21/2021 1347   LYMPHSABS 0.9 01/16/2011 1635   MONOABS 0.7 01/16/2011 1635   EOSABS 0.1 01/16/2011 1635   BASOSABS 0.0 01/16/2011 1635   Chest imaging: CTA Chest 08/21/21 No evidence of pulmonary embolism.   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   Suspected splenomegaly, incompletely visualized.   Aortic Atherosclerosis  PFT: PFT Results Latest Ref Rng & Units 11/18/2021 07/13/2015  FVC-Pre L 2.65 3.35  FVC-Predicted Pre % 78 94  FVC-Post L 2.77 3.49  FVC-Predicted Post % 81 98  Pre FEV1/FVC % % 77 75  Post FEV1/FCV % % 74 77  FEV1-Pre L 2.04 2.52  FEV1-Predicted Pre % 79 91  FEV1-Post L 2.05 2.70  DLCO uncorrected  ml/min/mmHg 20.80 22.64  DLCO UNC% % 98 83  DLCO corrected ml/min/mmHg 20.80 -  DLCO COR %Predicted % 98 -  DLVA Predicted % 123 95  TLC L 5.14 5.73  TLC % Predicted % 95 107  RV % Predicted % 101 115  PFT 2018: within normal limits.  Labs: 04/22/2021 AST 56, ALT 52, alk phos 164 CBC showed absolute eosinophil count 500, normal hemoglobin and low normal platelets 143,000  Path:  Echo 05/2021: LVEF 60 to 65%.  Normal diastolic filling pattern.  Mild grade 1 mitral regurgitation.  No significant change from 2018.  Heart Catheterization:  Assessment & Plan:   Shortness of breath  Abnormal CT of the chest  Discussion: [name] [name] is a 66 year old woman, former smoker with myotonic dystrophy and sleep apnea who returns to pulmonary clinic for abnormal CT scan and shortness of breath.   Her CT scan does show scattered tree-in-bud infiltrates but she remains asymptomatic at this time without cough, fever and chills.  She has no signs of weight loss or loss of appetite.  We will continue to monitor for any changes in her clinical status.  Her shortness of breath has resolved, she does not require inhaler therapy at this time. Her pulmonary function tests are within normal limits.   She is seeing allergy/immunology for low IgA level.   Follow up in 1 year  Freda Jackson, MD [facility] Office: [phone]   Current Outpatient Medications:    ALPRAZolam (XANAX) 0.5 MG tablet, Take 0.5 mg by mouth at bedtime as needed for anxiety., Disp: , Rfl:    aspirin EC 81 MG tablet, Take  81 mg by mouth daily. Swallow whole., Disp: , Rfl:    b complex vitamins capsule, Take 1 capsule by mouth daily., Disp: , Rfl:    Bacillus Coagulans-Inulin (ALIGN PREBIOTIC-PROBIOTIC PO), Take by mouth., Disp: , Rfl:    calcium-vitamin D 250-100 MG-UNIT per tablet, Take 1 tablet by mouth 2 (two) times daily., Disp: , Rfl:    cetirizine (ZYRTEC) 10 MG tablet, Take 10 mg by mouth  daily., Disp: , Rfl:    Cobalamin Combinations (NEURIVA PLUS) CAPS, Take 1 capsule by mouth daily., Disp: , Rfl:    Collagen-Vitamin C-Biotin (COLLAGEN 1500/C PO), Take by mouth., Disp: , Rfl:    cyanocobalamin 1000 MCG tablet, Take by mouth., Disp: , Rfl:    losartan-hydrochlorothiazide (HYZAAR) 100-12.5 MG tablet, Take 1 tablet by mouth daily., Disp: , Rfl:    Multiple Vitamin (MULTI-VITAMIN) tablet, Take by mouth., Disp: , Rfl:    NON FORMULARY, Take 1-4 capsules by mouth as needed. TERRA ZYME, Disp: , Rfl:    traMADol (ULTRAM) 50 MG tablet, Take by mouth every 6 (six) hours as needed. Take two twice a day, Disp: , Rfl:    Turmeric 450 MG CAPS, Take 1 capsule by mouth daily., Disp: , Rfl:    zolpidem (AMBIEN) 10 MG tablet, Take 10 mg by mouth at bedtime as needed for sleep., Disp: , Rfl:

## 2021-12-20 NOTE — Patient Instructions (Signed)
Your pulmonary function testing is normal  Ok to stay off of the inhalers at this time.  We will schedule follow up in 1 year

## 2021-12-27 ENCOUNTER — Encounter: Payer: Self-pay | Admitting: Allergy & Immunology

## 2021-12-27 ENCOUNTER — Other Ambulatory Visit: Payer: Self-pay

## 2021-12-27 ENCOUNTER — Ambulatory Visit (INDEPENDENT_AMBULATORY_CARE_PROVIDER_SITE_OTHER): Payer: Medicare Other | Admitting: Allergy & Immunology

## 2021-12-27 VITALS — BP 116/70 | HR 87 | Temp 97.6°F | Resp 18 | Ht 66.0 in | Wt 170.0 lb

## 2021-12-27 DIAGNOSIS — J310 Chronic rhinitis: Secondary | ICD-10-CM

## 2021-12-27 DIAGNOSIS — D802 Selective deficiency of immunoglobulin A [IgA]: Secondary | ICD-10-CM | POA: Diagnosis not present

## 2021-12-27 DIAGNOSIS — R899 Unspecified abnormal finding in specimens from other organs, systems and tissues: Secondary | ICD-10-CM

## 2021-12-27 NOTE — Patient Instructions (Addendum)
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 - If this is indeed IgA deficiency only, most people are asymptomatic and need no further workup. - But the labs should show up more.  - We will obtain some screening labs to evaluate your immune system.  - Labs to evaluate the quantitative [facility]) aspects of your immune system: CBC with differential (looks at the different white blood cell types etc). - Labs to evaluate the qualitative ([place]) aspects of your immune system: CH50, Pneumococcal titers, Tetanus titers, Diphtheria titers - We may consider immunizations with Pneumovax and Tdap to challenge your immune system, and then obtain repeat titers in 4-6 weeks.   2. Chronic rhinitis  - We will also add on an environmental allergy panel via the blood since we are getting blood work anyway.  - Consider changing up your antihistamines every 1-2 months to make them more effective. - If your rhinitis/sinus pressure symptoms worsen, we can come up with a more aggressive nasal regimen for you, but right now you seem to have no symptoms at all.   3. Return in about 2 months (around 02/24/2022).    Please inform us of any Emergency Department visits, hospitalizations, or changes in symptoms. Call us before going to the ED for breathing or allergy symptoms since we might be able to fit you in for a sick visit. Feel free to contact us anytime with any questions, problems, or concerns.  It was a pleasure to meet you today!  Websites that have reliable patient information: 1. American Academy of Asthma, Allergy, and Immunology: www.aaaai.org 2. Food Allergy Research and Education (FARE): foodallergy.org 3. Mothers of Asthmatics: http://www.asthmacommunitynetwork.org 4. American College of Allergy, Asthma, and Immunology: www.acaai.org   COVID-19 Vaccine Information can be found at:  [email or web address].[email or web address].uk For questions related to vaccine distribution or appointments, please email [email or web address] or call [phone].   We realize that you might be concerned about having an allergic reaction to the COVID19 vaccines. To help with that concern, WE ARE OFFERING THE COVID19 VACCINES IN OUR OFFICE! Ask the front desk for dates!     Like [place] on [place] and Instagram for our latest updates!      A healthy democracy works best when [employer] participate! Make sure you are registered to vote! If you have moved or changed any of your contact information, you will need to get this updated before voting!  In some cases, you MAY be able to register to vote online: [email or web address]

## 2021-12-27 NOTE — Progress Notes (Signed)
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  Many IgA deficient patients have no symptoms at all, but the ones to do mostly have recurrent sinopulmonary infections.  [name] [name] seems to fit that mold perfectly.  [name] [name] feels that her symptoms have overall improved since changing her antihistamines around.  Its been several months since [name] [name] has required any antibiotics.  It should be noted that MAI is rarely associated with any immune deficiencies. Oftentimes, we will treat IgA deficient patients with aggressive nasal toilet regimens, but [name] [name] seems to have no rhinitis symptoms at all so I do not think that is necessary at this point in time.  We are going to complete her work-up with a few more labs, including vaccination titers.  We may do a Pneumovax with repeat titers if needed.  I did tell her to change her antihistamines around every couple of months to prevent tachyphylaxis.  [name] [name] seems to have a good handle on her symptoms.  We will see her in 2 months to see how [name] [name] is doing and make changes accordingly.    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 - If this is indeed IgA deficiency only, most people are asymptomatic and need no further workup. - But the labs should show up more.  - We will obtain some screening labs to evaluate your immune system.  - Labs to evaluate the quantitative [facility]) aspects of your immune system: CBC with differential (looks at the different white blood cell types etc). - Labs to evaluate the  qualitative ([place]) aspects of your immune system: CH50, Pneumococcal titers, Tetanus titers, Diphtheria titers - We may consider immunizations with Pneumovax and Tdap to challenge your immune system, and then obtain repeat titers in 4-6 weeks.   2. Chronic rhinitis  - We will also add on an environmental allergy panel via the blood since we are getting blood work anyway.  - Consider changing up your antihistamines every 1-2 months to make them more effective. - If your rhinitis/sinus pressure symptoms worsen, we can come up with a more aggressive nasal regimen for you, but right now you seem to have no symptoms at all.   3. Return in about 2 months (around 02/24/2022).     This note in its entirety was forwarded to the Provider who requested this consultation.  Subjective:   [name] [name] is a 66 y.o. 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  [name] [name] was referred by [name] Boston, MD.     [name] [name] is a 66 y.o. female presenting for an evaluation of absent IgA  [name] [name] has a history of recurrent infections.  [name] [name] has pulmonary infections or sinus infections around once a month. But this has all improved since [name] [name] made some medication changes per the patient. [name] [name] has had infections for a number of  years. [name] [name] has never had an immune work up at this point in time. [name] [name] had an immunoglobulin panel sent in October 2022 that showed an absent IgA, low IgM of 34, and normal IgG of 916.  [name] [name] ended up stopping Allegra and [name] [name] had improvement in phlegm production. [name] [name] did that two weeks ago. [name] [name] has a slightly drippy nose, but otherwise [name] [name] has felt very good. [name] [name] was on it chronically for sinus infections and [name] [name] switched to [place]. [name] [name] has been having sinus infections monthly for years. Triggers included changing her environment especially with seeing her family members. Typically [name] [name] goes to her PCP if [name] [name] is in town and then [name] [name] will go to Urgent Care when [name] [name] is out of town. [name] [name] also needs antibiotics to clear it. [name] [name] denies any skin infections, including abscesses. [name] [name] has never had wound infections at all from her back surgeries.   [name] [name] has has never needed IV antibiotics and has never been hospitalized for an infection. [name] [name] has been seen by ENT years ago, but [name] [name] cannot remember the name. [name] [name] did had sinus surgery when [name] [name] was 66 years old and had a deviated septum.   Asthma/Respiratory Symptom History: He is followed by Dr. Erin [name] of Pulmonology. [name] [name] has a history of a chest CT with scattered tree-in-bud infiltrates in the left lung apex, left upper lobe, and inferior right middle lobe and lingula..  At the last visit with her pulmonologist and January, [name] [name] was asymptomatic without cough, fever, or chills.  Continued monitoring was recommended.  Pulmonary function tests have been normal.  [name] [name] did have an ANCA panel which was low normal at 1:20. Dr. Erin [name] did not recommend further work-up. There is discussion that this might be MAC, but [name] [name] is not on treatment for that. Evidently because her lung function was good and [name] [name] was not having coughing or wheezing, he did not feel that they needed to start any kind of antibiotics for presumed MAC.   This chest CT was done originally due to SOB and coughing.  [name] [name] is no longer having these issues. [name] [name] was on Anoro for a short period of time and it made her irirtable. [name] [name] is now doing a tea with lemon and honey and this is working wlel.   Allergic Rhinitis Symptom History: [name] [name] has not been allergy tested. [name] [name] was taking cetirizine off and on and this seemed to help with her sinus infections. As long as [name] [name] was on cetirizine, [name] [name] did well. But [name] [name] changed to [place] and her symptoms worsened. [name] [name] has tried multiple nose sprays but never felt that they helped at all.  Otherwise, there is no history of other atopic diseases, including food allergies, drug allergies, stinging insect allergies, eczema, urticaria, or contact dermatitis. There is no significant infectious history. Vaccinations are up to date.    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 If you have any questions, ask your nurse or doctor.          ALIGN PREBIOTIC-PROBIOTIC PO Take by mouth.   ALPRAZolam 0.5 MG tablet Commonly known as: XANAX Take 0.5 mg by mouth at bedtime as needed for anxiety.   aspirin EC 81 MG tablet Take 81 mg by mouth daily. Swallow whole.   b complex vitamins capsule Take 1 capsule by mouth daily.   calcium-vitamin D 250-100 MG-UNIT tablet Take 1 tablet by mouth 2 (two) times daily.   cetirizine 10 MG tablet Commonly known as: ZYRTEC Take 10  mg by mouth daily.   COLLAGEN 1500/C PO Take by mouth.   cyanocobalamin 1000 MCG tablet Take by mouth.   losartan-hydrochlorothiazide 100-12.5 MG tablet Commonly known as: HYZAAR Take 1 tablet by mouth daily.   Multi-Vitamin tablet Take by mouth.   Neuriva Plus Caps Take 1 capsule by mouth daily.   NON FORMULARY Take 1-4 capsules by mouth as needed. TERRA ZYME   traMADol 50 MG tablet Commonly known as: ULTRAM Take by mouth every 6 (six) hours as needed. Take two twice a day   Turmeric 450 MG Caps Take 1 capsule by mouth daily.   zolpidem 10 MG tablet Commonly known as: AMBIEN Take 10 mg by mouth at bedtime as needed for sleep.        Birth History: non-contributory  Developmental History: non-contributory  Past Surgical History: Past Surgical History:  Procedure Laterality Date   BACK SURGERY       Family History: Family History  Problem Relation Age of Onset   Lupus Mother    Heart attack Mother    Heart attack Father      Social History: [name] [name] lives at home with her family.  [name] [name] was in a townhome.  There is wood in the main living areas and carpeting in the bedroom.  [name] [name] has electric heating and central cooling.  There are no animals inside or outside of the home.  There are dust mite covers on the bed, but not the pillows.  There is no tobacco exposure.  [name] [name] is currently retired.  [name] [name] does use a HEPA filter.  There is no fume, chemical, or dust exposure.  [name] [name] was a smoker for 40 years but has quit. [name] [name] has one son. [name] [name] sotpped smoking around 30 years ago.    Review of Systems  Constitutional: Negative.  Negative for chills, fever, malaise/fatigue and weight loss.  HENT: Negative.  Negative for congestion, ear discharge and ear pain.   Eyes:  Negative for pain, discharge and redness.  Respiratory:  Positive for cough. Negative for sputum production, shortness of breath and wheezing.   Cardiovascular: Negative.  Negative for chest pain and palpitations.   Gastrointestinal:  Negative for abdominal pain, heartburn, nausea and vomiting.  Skin: Negative.  Negative for itching and rash.  Neurological:  Negative for dizziness and headaches.  Endo/Heme/Allergies:  Positive for environmental allergies. Does not bruise/bleed easily.      Objective:   Blood pressure 116/70, pulse 87, temperature 97.6 F (36.4 C), resp. rate 18, height _0  (1.676 m), weight 170 lb (77.1 kg), SpO2 97 %. Body mass index is 27.44 kg/m.   Physical Exam:   Physical Exam Vitals reviewed.  Constitutional:      Appearance: [name] [name] is well-developed.     Comments: In a walker.  HENT:     Head: Normocephalic and atraumatic.     Right Ear: Tympanic membrane, ear canal and external ear normal. No drainage, swelling or tenderness. Tympanic membrane is not injected, scarred, erythematous, retracted or bulging.     Left Ear: Tympanic membrane, ear canal and external ear normal. No drainage, swelling or tenderness. Tympanic membrane is not injected, scarred, erythematous, retracted or bulging.     Nose: No nasal deformity, septal deviation, mucosal edema or rhinorrhea.     Right Turbinates: Not enlarged or swollen.     Left Turbinates: Not enlarged or swollen.     Right Sinus: No maxillary sinus tenderness or frontal sinus tenderness.     Left Sinus: No maxillary sinus tenderness or frontal sinus tenderness.     Comments: Turbinates are erythematous.    Mouth/Throat:     Mouth: Mucous membranes are not pale and not dry.     Pharynx: Uvula midline.     Comments: Minimal cobblestoning. Eyes:     General:        Right eye: No discharge.        Left eye: No discharge.     Conjunctiva/sclera: Conjunctivae normal.     Right eye: Right conjunctiva is not injected. No chemosis.    Left eye: Left conjunctiva is not injected. No chemosis.    Pupils: Pupils are equal, round, and reactive to light.  Cardiovascular:     Rate and Rhythm: Normal rate and regular rhythm.      Heart sounds: Normal heart sounds.  Pulmonary:     Effort: Pulmonary effort is normal. No tachypnea, accessory muscle usage or respiratory distress.     Breath sounds: Normal breath sounds. No wheezing, rhonchi or rales.  Chest:     Chest wall: No tenderness.  Abdominal:     Tenderness: There is no abdominal tenderness. There is no guarding or rebound.  Lymphadenopathy:     Head:     Right side of head: No submandibular, tonsillar or occipital adenopathy.     Left side of head: No submandibular, tonsillar or occipital adenopathy.     Cervical: No cervical adenopathy.  Skin:    Coloration: Skin is not pale.     Findings: No abrasion, erythema, petechiae or rash. Rash is not papular, urticarial or vesicular.  Neurological:     Mental Status: [name] [name] is alert.  Psychiatric:        Behavior: Behavior is cooperative.     Diagnostic studies: labs sent instead         Salvatore Marvel, MD [facility] [place]

## 2021-12-29 ENCOUNTER — Encounter: Payer: Self-pay | Admitting: Allergy & Immunology

## 2021-12-29 ENCOUNTER — Telehealth: Payer: Self-pay | Admitting: *Deleted

## 2021-12-29 NOTE — Telephone Encounter (Signed)
Patient called to go over lab results, I did advise that they were all not back yet and that once they are all back Dr. Ernst Bowler will interpret them and we will call her back. Patient verbalized understanding.

## 2021-12-29 NOTE — Telephone Encounter (Signed)
Sounds good. Thanks!   Greenley Martone, MD [facility]  

## 2021-12-30 LAB — ALLERGEN PROFILE, MOLD
Aureobasidi Pullulans IgE: <0.1 kU/L
Candida Albicans IgE: <0.1 kU/L
M009-IgE Fusarium proliferatum: <0.1 kU/L
M014-IgE Epicoccum purpur: <0.1 kU/L
Mucor Racemosus IgE: <0.1 kU/L
Phoma Betae IgE: <0.1 kU/L
Setomelanomma Rostrat: <0.1 kU/L
Stemphylium Herbarum IgE: <0.1 kU/L

## 2021-12-30 LAB — ALLERGENS W/COMP RFLX AREA 2
Alternaria Alternata IgE: <0.1 kU/L
Aspergillus Fumigatus IgE: <0.1 kU/L
Bermuda Grass IgE: <0.1 kU/L
Cedar, Mountain IgE: <0.1 kU/L
Cladosporium Herbarum IgE: <0.1 kU/L
Cockroach, German IgE: <0.1 kU/L
Common Silver Birch IgE: <0.1 kU/L
Cottonwood IgE: <0.1 kU/L
D Farinae IgE: <0.1 kU/L
D Pteronyssinus IgE: <0.1 kU/L
E001-IgE Cat Dander: <0.1 kU/L
E005-IgE Dog Dander: <0.1 kU/L
Elm, American IgE: <0.1 kU/L
IgE (Immunoglobulin E), Serum: 3 IU/mL — ABNORMAL LOW (ref 6–495)
Johnson Grass IgE: <0.1 kU/L
Maple/Box Elder IgE: <0.1 kU/L
Mouse Urine IgE: <0.1 kU/L
Oak, White IgE: <0.1 kU/L
Pecan, Hickory IgE: <0.1 kU/L
Penicillium Chrysogen IgE: <0.1 kU/L
Pigweed, Rough IgE: <0.1 kU/L
Ragweed, Short IgE: <0.1 kU/L
Sheep Sorrel IgE Qn: <0.1 kU/L
Timothy Grass IgE: <0.1 kU/L
White Mulberry IgE: <0.1 kU/L

## 2022-01-09 ENCOUNTER — Telehealth: Payer: Self-pay | Admitting: *Deleted

## 2022-01-09 NOTE — Telephone Encounter (Signed)
Patient called for her lab results, I advised that all of her labs were not back yet and once they were Dr. Ernst Bowler would review them and we would call her, patient seemed annoyed by this and stated that she is going out of town in 2 days and thought they would all be back by now. I advised as soon as they are back and have been reviewed we will call her with results.

## 2022-01-10 LAB — DIPHTHERIA / TETANUS ANTIBODY PANEL
Diphtheria Ab: 0.34 IU/mL (ref <0.10)
Tetanus Ab, IgG: 5.58 IU/mL (ref <0.10)

## 2022-01-10 LAB — STREP PNEUMONIAE 23 SEROTYPES IGG
Pneumo Ab Type 1*: 0.3 ug/mL — ABNORMAL LOW (ref >1.3)
Pneumo Ab Type 12 (12F)*: 0.9 ug/mL — ABNORMAL LOW (ref >1.3)
Pneumo Ab Type 14*: 5.2 ug/mL (ref >1.3)
Pneumo Ab Type 17 (17F)*: 0.8 ug/mL — ABNORMAL LOW (ref >1.3)
Pneumo Ab Type 19 (19F)*: 9.1 ug/mL (ref >1.3)
Pneumo Ab Type 2*: 1.9 ug/mL (ref >1.3)
Pneumo Ab Type 20*: 1.4 ug/mL (ref >1.3)
Pneumo Ab Type 22 (22F)*: 0.6 ug/mL — ABNORMAL LOW (ref >1.3)
Pneumo Ab Type 23 (23F)*: 1.5 ug/mL (ref >1.3)
Pneumo Ab Type 26 (6B)*: 6.4 ug/mL (ref >1.3)
Pneumo Ab Type 3*: 0.3 ug/mL — ABNORMAL LOW (ref >1.3)
Pneumo Ab Type 34 (10A)*: 1.3 ug/mL — ABNORMAL LOW (ref >1.3)
Pneumo Ab Type 4*: 0.7 ug/mL — ABNORMAL LOW (ref >1.3)
Pneumo Ab Type 43 (11A)*: <0.1 ug/mL — ABNORMAL LOW (ref >1.3)
Pneumo Ab Type 5*: <0.1 ug/mL — ABNORMAL LOW (ref >1.3)
Pneumo Ab Type 51 (7F)*: 8.5 ug/mL (ref >1.3)
Pneumo Ab Type 54 (15B)*: 2.1 ug/mL (ref >1.3)
Pneumo Ab Type 56 (18C)*: 1.4 ug/mL (ref >1.3)
Pneumo Ab Type 57 (19A)*: 1.7 ug/mL (ref >1.3)
Pneumo Ab Type 68 (9V)*: 1.2 ug/mL — ABNORMAL LOW (ref >1.3)
Pneumo Ab Type 70 (33F)*: 3.8 ug/mL (ref >1.3)
Pneumo Ab Type 8*: 1.6 ug/mL (ref >1.3)
Pneumo Ab Type 9 (9N)*: 3.1 ug/mL (ref >1.3)

## 2022-01-10 LAB — CBC WITH DIFFERENTIAL
Basophils Absolute: 0 10*3/uL (ref 0.0–0.2)
Basos: 1 %
EOS (ABSOLUTE): 0.3 10*3/uL (ref 0.0–0.4)
Eos: 6 %
Hematocrit: 48 % — ABNORMAL HIGH (ref 34.0–46.6)
Hemoglobin: 16.5 g/dL — ABNORMAL HIGH (ref 11.1–15.9)
Immature Grans (Abs): 0 10*3/uL (ref 0.0–0.1)
Immature Granulocytes: 0 %
Lymphocytes Absolute: 1.1 10*3/uL (ref 0.7–3.1)
Lymphs: 22 %
MCH: 30.1 pg (ref 26.6–33.0)
MCHC: 34.4 g/dL (ref 31.5–35.7)
MCV: 87 fL (ref 79–97)
Monocytes Absolute: 0.4 10*3/uL (ref 0.1–0.9)
Monocytes: 8 %
Neutrophils Absolute: 3.1 10*3/uL (ref 1.4–7.0)
Neutrophils: 63 %
RBC: 5.49 x10E6/uL — ABNORMAL HIGH (ref 3.77–5.28)
RDW: 13.2 % (ref 11.7–15.4)
WBC: 4.9 10*3/uL (ref 3.4–10.8)

## 2022-01-10 LAB — COMPLEMENT, TOTAL: Compl, Total (CH50): 37 U/mL — ABNORMAL LOW (ref >41)

## 2022-01-10 LAB — IGG, IGA, IGM
IgA/Immunoglobulin A, Serum: <5 mg/dL — ABNORMAL LOW (ref 87–352)
IgG (Immunoglobin G), Serum: 951 mg/dL (ref 586–1602)
IgM (Immunoglobulin M), Srm: 26 mg/dL (ref 26–217)

## 2022-01-12 NOTE — Addendum Note (Signed)
Addended by: Valentina Shaggy on: 01/12/2022 08:31 AM   Modules accepted: Orders

## 2022-01-13 NOTE — Telephone Encounter (Signed)
I wrote a result note yesterday.  Salvatore Marvel, MD [facility] [place] [facility] [place]

## 2022-02-26 IMAGING — CT CT NECK W/ CM
4 series · 14 of 29 positions shown, 17 images · IV contrast (iopamidol)
Comparison: None.
COMPARISON: None.

Addendum:
CLINICAL DATA: Cervical lymphadenopathy

EXAM:
CT NECK WITH CONTRAST
TECHNIQUE: Multidetector CT imaging of the neck was performed using the
standard protocol following the bolus administration of intravenous
contrast.
CONTRAST:  75mL [id number] IOPAMIDOL ([id number]) INJECTION 61%

[Series 3: neck 2.0 st · axial · 0.42mm/px · z∈[-289,-245]mm · 2 of 131 slices shown (1 of 3)]
[im 22/131  bone]
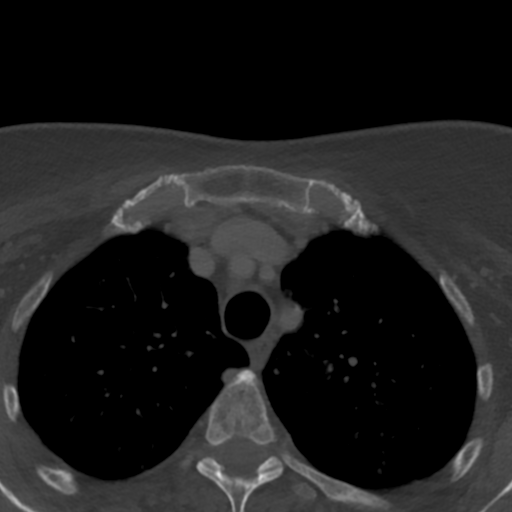
[im 44/131  bone]
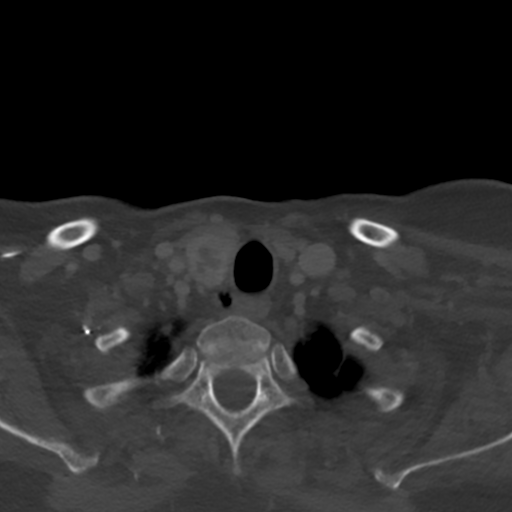

[Series 5: neck 2.0 st · sagittal · 0.49mm/px · 5 of 104 slices shown, 6 images (2 of 3)]
[im 35/104  bone]
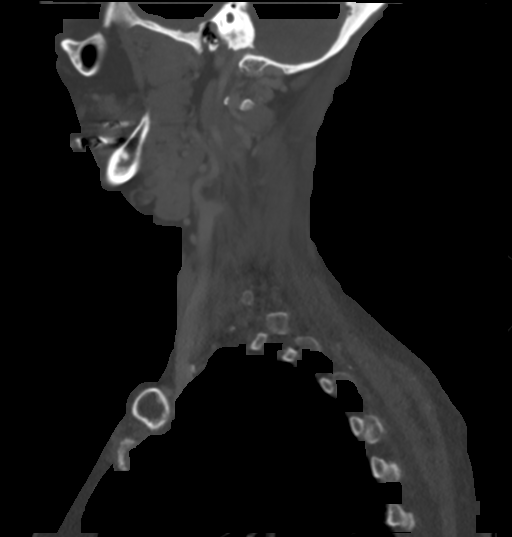
[im 43/104  bone]
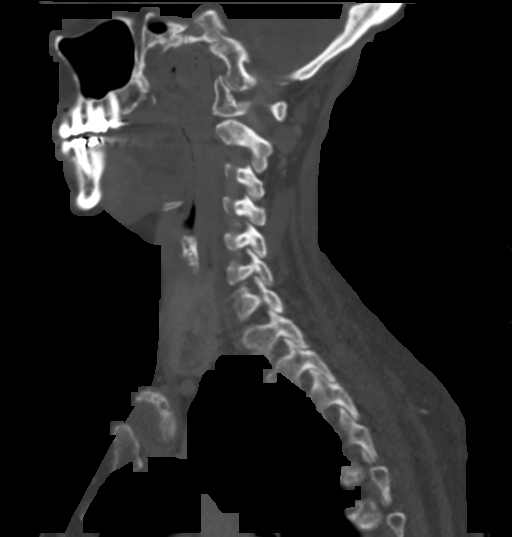
[im 52/104  soft-tissue]
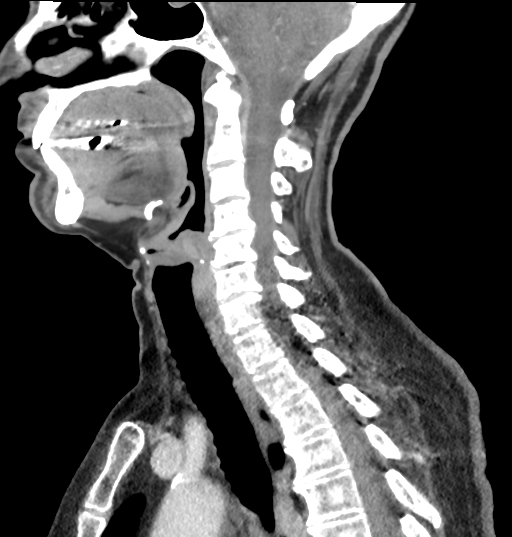
[im 52/104  bone]
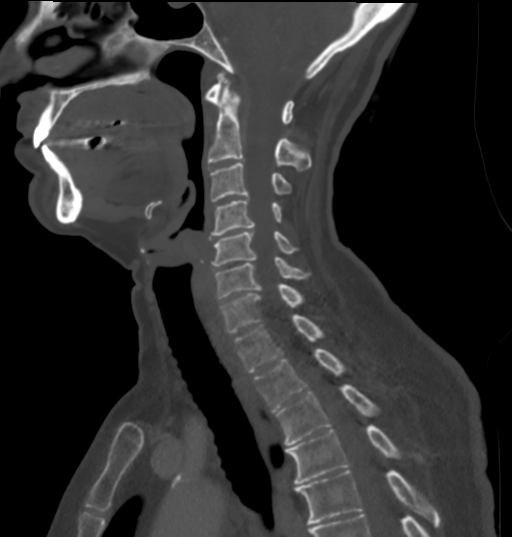
[im 61/104  bone]
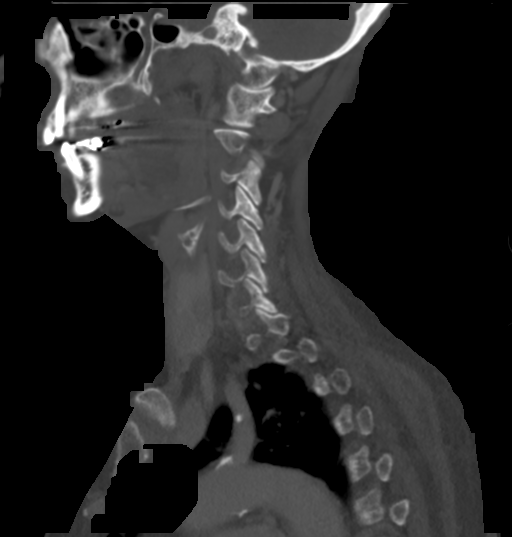
[im 69/104  bone]
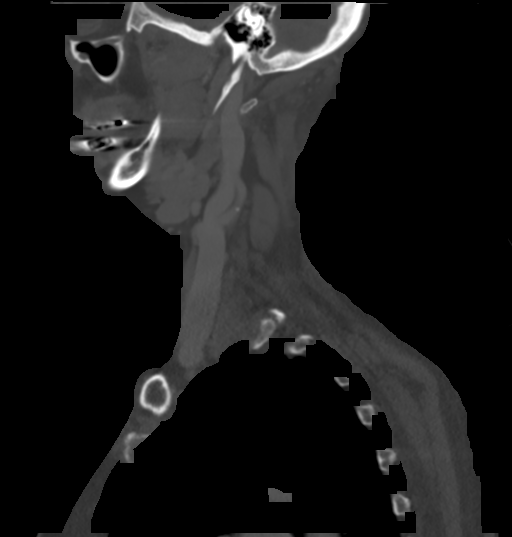

[Series 6: neck 2.0 st · coronal · 0.51mm/px · 2 of 112 slices shown (3 of 3)]
[im 110/112  bone]
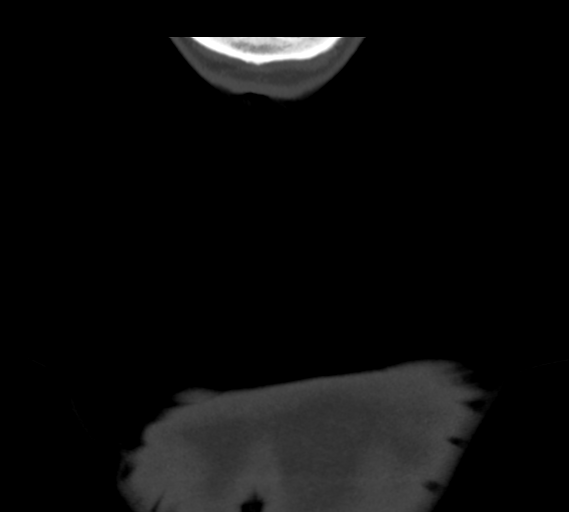
[im 111/112  bone]
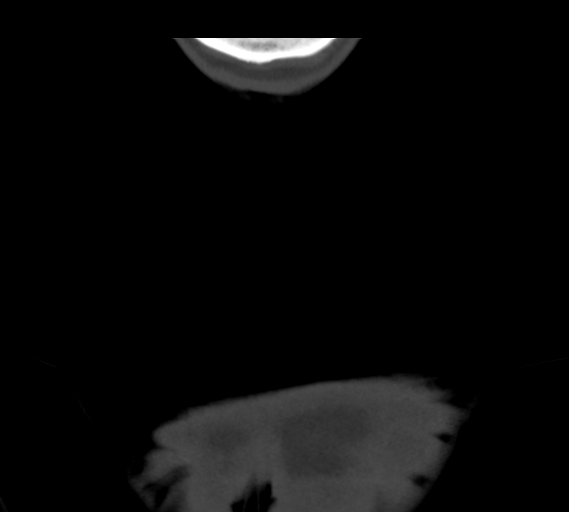

[Series 7: neck 2.0 st orthogonal · axial · 0.39mm/px · z∈[-303,-131]mm · 5 of 131 slices shown, 7 images]
[im 22/131  soft-tissue]
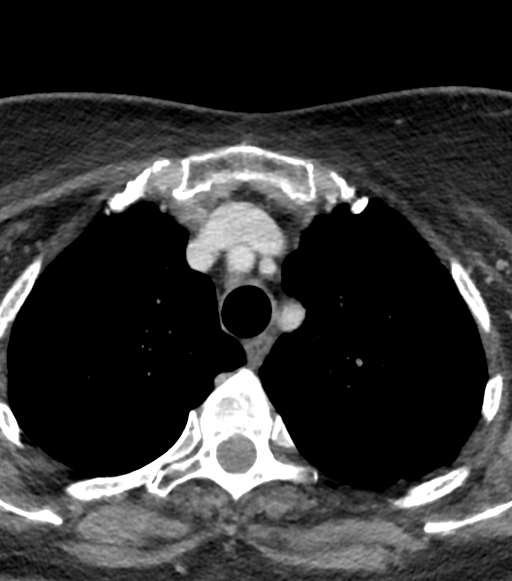
[im 22/131  bone]
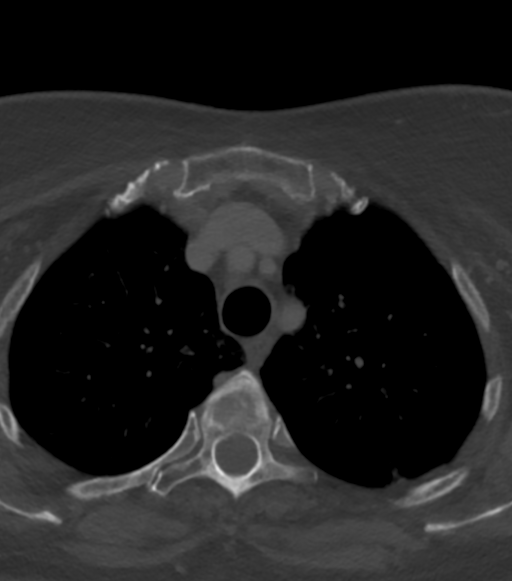
[im 44/131  bone]
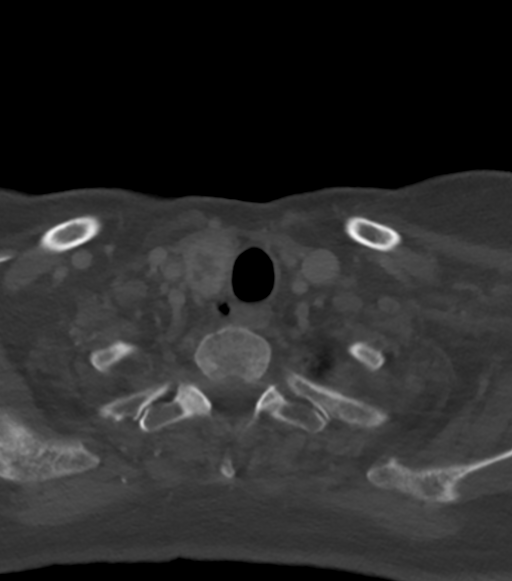
[im 66/131  bone]
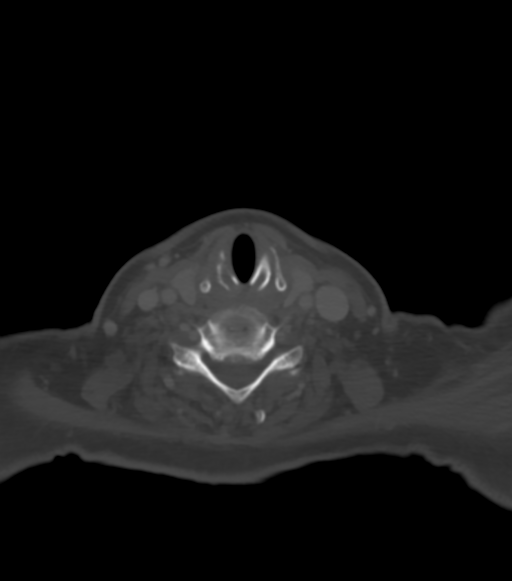
[im 87/131  bone]
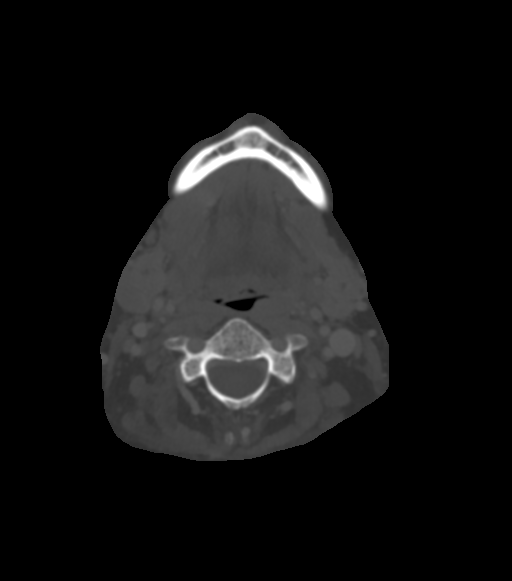
[im 109/131  soft-tissue]
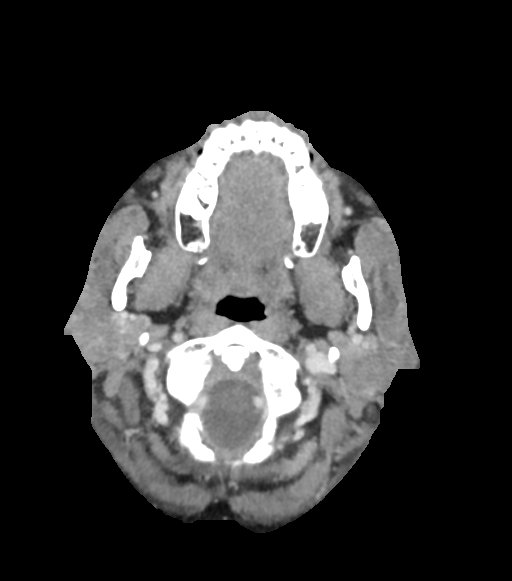
[im 109/131  bone]
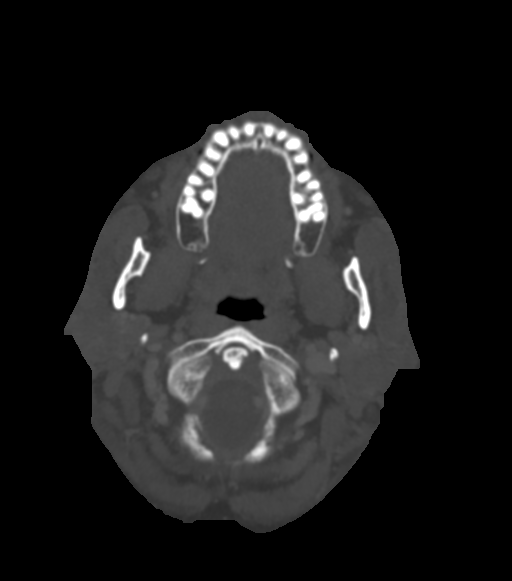

[14 of 29 positions shown; findings below may reference images not displayed]

FINDINGS: Pharynx and larynx: Unremarkable.  No mass or swelling.

Salivary glands: Parotid and submandibular glands are unremarkable.

Thyroid: 2.1 cm right thyroid nodule previously evaluated by
ultrasound and biopsy.

Lymph nodes: No enlarged or abnormal density nodes.

Vascular: Major neck vessels are patent. Minimal calcified plaque
along the proximal ICAs.

Limited intracranial: No abnormal enhancement.

Visualized orbits: Unremarkable.

Mastoids and visualized paranasal sinuses: Patchy mucosal
thickening. Mastoid air cells are clear.

Skeleton: Degenerative changes of the cervical spine.

Upper chest: Scattered tree-in-bud opacities and centrilobular
nodules.

Other: Skin marker overlies the right neck superficial to the
submandibular gland. No significant abnormalities identified in this
region.
IMPRESSION: No adenopathy.  Skin marker overlies the right submandibular gland.

ADDENDUM:
Not included in the initial impression, there are scattered
tree-in-bud opacities and centrilobular nodules likely representing
chronic infectious/inflammatory process such as KAKI. See prior chest
CT.

*** End of Addendum ***
FINDINGS: Pharynx and larynx: Unremarkable.  No mass or swelling.

Salivary glands: Parotid and submandibular glands are unremarkable.

Thyroid: 2.1 cm right thyroid nodule previously evaluated by
ultrasound and biopsy.

Lymph nodes: No enlarged or abnormal density nodes.

Vascular: Major neck vessels are patent. Minimal calcified plaque
along the proximal ICAs.

Limited intracranial: No abnormal enhancement.

Visualized orbits: Unremarkable.

Mastoids and visualized paranasal sinuses: Patchy mucosal
thickening. Mastoid air cells are clear.

Skeleton: Degenerative changes of the cervical spine.

Upper chest: Scattered tree-in-bud opacities and centrilobular
nodules.

Other: Skin marker overlies the right neck superficial to the
submandibular gland. No significant abnormalities identified in this
region.
IMPRESSION: No adenopathy.  Skin marker overlies the right submandibular gland.

## 2022-06-23 ENCOUNTER — Ambulatory Visit (INDEPENDENT_AMBULATORY_CARE_PROVIDER_SITE_OTHER): Payer: Medicare Other

## 2022-06-23 ENCOUNTER — Ambulatory Visit (INDEPENDENT_AMBULATORY_CARE_PROVIDER_SITE_OTHER): Payer: Medicare Other | Admitting: Pulmonary Disease

## 2022-06-23 ENCOUNTER — Encounter: Payer: Self-pay | Admitting: Pulmonary Disease

## 2022-06-23 VITALS — BP 116/74 | HR 67 | Ht 66.0 in | Wt 163.0 lb

## 2022-06-23 DIAGNOSIS — R0602 Shortness of breath: Secondary | ICD-10-CM | POA: Diagnosis not present

## 2022-06-23 NOTE — Progress Notes (Unsigned)
Synopsis: Referred in October 2022 for abdnormal CT scan by Claud Kelp, NP  Subjective:   PATIENT ID: [name] [name] [name] [name]: female DOB: 04-28-1956, MRN: [id number]  HPI  Chief Complaint  Patient presents with   Follow-up    Increased SOB and productive cough for the past 2-3 months.    [name] [name] is a 66 year old woman, former smoker with myotonic dystrophy and sleep apnea on CPAP who returns to pulmonary clinic for abnormal CT scan and shortness of breath.   Went to urgent and treated for pneumonia, did not feel better. She was on 2 antibiotics.   Shortness of breath and cough.  EGD and C-Scope coming in a week. Dr. Benson Norway  She has loss of appetite and upset stomach. Has nausea. No vomiting.  +night sweats, recently  Dyspnea at rest and with exertion Coughing up green/brown sputum occasionally   OV 12/20/21 She has been doing well since last visit. She reports her shortness of breath has resolved. She was not able to tolerate Anoro ellipta due to feeling jittery. She has found a natural tea remedy which includes ginger, honey and tea that has helped her breathing and sinuses. She has stopped taking daily allergy medicine as well. She does complain of some sinus congestion and post-nasal drainage.  Her PFTs are within normal limits today but do show mildly reduced FEV1. Otherwise normal TLC and DLCO.   She has up coming evaluation with allergy/immunology for low IgA level.  OV 09/22/21 She had CT chest on  08/21/21 for chest pain which showed tree-in-bud nodularity in the left lung apex, right upper lobe and inferior right middle lobe and lingula.   She was treated for pneumonia 2 months ago with antibiotics and prednisone with improvement in her symptoms. She reports having covid last month. She denies any cough or wheezing currently. She denies fevers but reports having night sweats thought secondary to menopause. She denies any weight loss or loss of appetite.  She  reports having chronic nasal drainage. She had nasal surgery when she was younger. She reports frequent sinus and pulmonary infections almost on a monthly basis. Her most recent dose of prednisone was a month ago.   She quit smoking 35 years ago.   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   Pituitary tumor    Recurrent upper respiratory infection (URI)    Tinnitus    Vitamin B deficiency      Family History  Problem Relation Age of Onset   Lupus Mother    Heart attack Mother    Heart attack Father      Social History   Socioeconomic History   Marital status: Divorced    Spouse name: Not on file   Number of children: 1   Years of education: Some colg   Highest education level: Not on file  Occupational History   Occupation: Unemployeed  Tobacco Use   Smoking status: Former    Packs/day: 0.50    Years: 20.00    Total pack years: 10.00    Types: Cigarettes    Quit date: 12/11/1988    Years since quitting: 33.5   Smokeless tobacco: Never  Substance and Sexual Activity   Alcohol use: Yes    Alcohol/week: 1.0  standard drink of alcohol    Types: 1 Glasses of wine per week    Comment: Rare   Drug use: No   Sexual activity: Not on file  Other Topics Concern   Not on file  Social History Narrative   1 caffeine drink a day   Patient is right handed.    Social Determinants of Health   Financial Resource Strain: Not on file  Food Insecurity: Not on file  Transportation Needs: Not on file  Physical Activity: Not on file  Stress: Not on file  Social Connections: Not on file  Intimate Partner Violence: Not on file     Allergies  Allergen Reactions   Doxycycline Shortness Of Breath     Outpatient Medications Prior to Visit  Medication Sig Dispense Refill   ALPRAZolam (XANAX) 0.5 MG tablet Take  0.5 mg by mouth at bedtime as needed for anxiety.     aspirin EC 81 MG tablet Take 81 mg by mouth daily. Swallow whole.     b complex vitamins capsule Take 1 capsule by mouth daily.     Bacillus Coagulans-Inulin (ALIGN PREBIOTIC-PROBIOTIC PO) Take by mouth.     calcium-vitamin D 250-100 MG-UNIT per tablet Take 1 tablet by mouth 2 (two) times daily.     cetirizine (ZYRTEC) 10 MG tablet Take 10 mg by mouth daily.     Cobalamin Combinations (NEURIVA PLUS) CAPS Take 1 capsule by mouth daily.     Collagen-Vitamin C-Biotin (COLLAGEN 1500/C PO) Take by mouth.     cyanocobalamin 1000 MCG tablet Take by mouth.     losartan-hydrochlorothiazide (HYZAAR) 100-12.5 MG tablet Take 1 tablet by mouth daily.     Multiple Vitamin (MULTI-VITAMIN) tablet Take by mouth.     NON FORMULARY Take 1-4 capsules by mouth as needed. TERRA ZYME     pantoprazole (PROTONIX) 40 MG tablet Take 40 mg by mouth daily.     traMADol (ULTRAM) 50 MG tablet Take by mouth every 6 (six) hours as needed. Take two twice a day     Turmeric 450 MG CAPS Take 1 capsule by mouth daily.     zolpidem (AMBIEN) 10 MG tablet Take 10 mg by mouth at bedtime as needed for sleep.     No facility-administered medications prior to visit.    Review of Systems  Constitutional:  Negative for chills, fever, malaise/fatigue and weight loss.  HENT:  Negative for congestion, sinus pain and sore throat.   Eyes: Negative.   Respiratory:  Negative for cough, hemoptysis, sputum production, shortness of breath and wheezing.   Cardiovascular:  Negative for chest pain, palpitations, orthopnea, claudication and leg swelling.  Gastrointestinal:  Negative for abdominal pain, heartburn, nausea and vomiting.  Genitourinary: Negative.   Musculoskeletal:  Negative for joint pain and myalgias.  Skin:  Negative for rash.  Neurological:  Negative for weakness.  Endo/Heme/Allergies: Negative.   Psychiatric/Behavioral:  The patient is not nervous/anxious.        Objective:   Vitals:   06/23/22 1158  BP: 116/74  Pulse: 67  SpO2: 97%  Weight: 163 lb (73.9 kg)  Height: _0  (1.676 m)     Physical Exam Constitutional:      General: She is not in acute distress.    Appearance: She is not ill-appearing.  HENT:     Head: Normocephalic and atraumatic.  Eyes:     General: No scleral icterus.    Conjunctiva/sclera: Conjunctivae normal.  Cardiovascular:     Rate and Rhythm:  Normal rate and regular rhythm.     Pulses: Normal pulses.     Heart sounds: Normal heart sounds. No murmur heard. Pulmonary:     Effort: Pulmonary effort is normal.     Breath sounds: Normal breath sounds. No wheezing, rhonchi or rales.  Musculoskeletal:     Right lower leg: No edema.     Left lower leg: No edema.  Skin:    General: Skin is warm and dry.  Neurological:     General: No focal deficit present.     Mental Status: She is alert.  Psychiatric:        Mood and Affect: Mood normal.        Behavior: Behavior normal.        Thought Content: Thought content normal.        Judgment: Judgment normal.     CBC    Component Value Date/Time   WBC 4.9 12/27/2021 1049   WBC 4.9 08/21/2021 1347   RBC 5.49 (H) 12/27/2021 1049   RBC 5.35 (H) 08/21/2021 1347   HGB 16.5 (H) 12/27/2021 1049   HCT 48.0 (H) 12/27/2021 1049   PLT 135 (L) 08/21/2021 1347   MCV 87 12/27/2021 1049   MCH 30.1 12/27/2021 1049   MCH 29.2 08/21/2021 1347   MCHC 34.4 12/27/2021 1049   MCHC 32.6 08/21/2021 1347   RDW 13.2 12/27/2021 1049   LYMPHSABS 1.1 12/27/2021 1049   MONOABS 0.7 01/16/2011 1635   EOSABS 0.3 12/27/2021 1049   BASOSABS 0.0 12/27/2021 1049   Chest imaging: CTA Chest 08/21/21 No evidence of pulmonary embolism.   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   Suspected splenomegaly, incompletely visualized.   Aortic Atherosclerosis  PFT:    Latest Ref Rng & Units  11/18/2021   10:43 AM 07/13/2015    8:42 AM  PFT Results  FVC-Pre L 2.65  3.35   FVC-Predicted Pre % 78  94   FVC-Post L 2.77  3.49   FVC-Predicted Post % 81  98   Pre FEV1/FVC % % 77  75   Post FEV1/FCV % % 74  77   FEV1-Pre L 2.04  2.52   FEV1-Predicted Pre % 79  91   FEV1-Post L 2.05  2.70   DLCO uncorrected ml/min/mmHg 20.80  22.64   DLCO UNC% % 98  83   DLCO corrected ml/min/mmHg 20.80    DLCO COR %Predicted % 98    DLVA Predicted % 123  95   TLC L 5.14  5.73   TLC % Predicted % 95  107   RV % Predicted % 101  115   PFT 2018: within normal limits.  Labs: 04/22/2021 AST 56, ALT 52, alk phos 164 CBC showed absolute eosinophil count 500, normal hemoglobin and low normal platelets 143,000  Path:  Echo 05/2021: LVEF 60 to 65%.  Normal diastolic filling pattern.  Mild grade 1 mitral regurgitation.  No significant change from 2018.  Heart Catheterization:  Assessment & Plan:   No diagnosis found.  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   Her CT scan does show scattered tree-in-bud infiltrates but she remains asymptomatic at this time without cough, fever and chills.  She has no signs of weight loss or loss of appetite.  We will continue to monitor for  any changes in her clinical status.  Her shortness of breath has resolved, she does not require inhaler therapy at this time. Her pulmonary function tests are within normal limits.   She is seeing allergy/immunology for low IgA level.   Follow up in 1 year  Freda Jackson, MD [facility] Office: [phone]   Current Outpatient Medications:    ALPRAZolam (XANAX) 0.5 MG tablet, Take 0.5 mg by mouth at bedtime as needed for anxiety., Disp: , Rfl:    aspirin EC 81 MG tablet, Take 81 mg by mouth daily. Swallow whole., Disp: , Rfl:    b complex vitamins  capsule, Take 1 capsule by mouth daily., Disp: , Rfl:    Bacillus Coagulans-Inulin (ALIGN PREBIOTIC-PROBIOTIC PO), Take by mouth., Disp: , Rfl:    calcium-vitamin D 250-100 MG-UNIT per tablet, Take 1 tablet by mouth 2 (two) times daily., Disp: , Rfl:    cetirizine (ZYRTEC) 10 MG tablet, Take 10 mg by mouth daily., Disp: , Rfl:    Cobalamin Combinations (NEURIVA PLUS) CAPS, Take 1 capsule by mouth daily., Disp: , Rfl:    Collagen-Vitamin C-Biotin (COLLAGEN 1500/C PO), Take by mouth., Disp: , Rfl:    cyanocobalamin 1000 MCG tablet, Take by mouth., Disp: , Rfl:    losartan-hydrochlorothiazide (HYZAAR) 100-12.5 MG tablet, Take 1 tablet by mouth daily., Disp: , Rfl:    Multiple Vitamin (MULTI-VITAMIN) tablet, Take by mouth., Disp: , Rfl:    NON FORMULARY, Take 1-4 capsules by mouth as needed. TERRA ZYME, Disp: , Rfl:    pantoprazole (PROTONIX) 40 MG tablet, Take 40 mg by mouth daily., Disp: , Rfl:    traMADol (ULTRAM) 50 MG tablet, Take by mouth every 6 (six) hours as needed. Take two twice a day, Disp: , Rfl:    Turmeric 450 MG CAPS, Take 1 capsule by mouth daily., Disp: , Rfl:    zolpidem (AMBIEN) 10 MG tablet, Take 10 mg by mouth at bedtime as needed for sleep., Disp: , Rfl:

## 2022-06-23 NOTE — Patient Instructions (Addendum)
We will request labs from [facility] advair inhaler, 2 puffs twice daily - rinse mouth out after each use  Follow up in 2 months

## 2022-06-26 ENCOUNTER — Encounter: Payer: Self-pay | Admitting: Pulmonary Disease

## 2022-07-10 ENCOUNTER — Emergency Department (HOSPITAL_COMMUNITY): Payer: Medicare Other

## 2022-07-10 ENCOUNTER — Encounter (HOSPITAL_COMMUNITY): Payer: Self-pay | Admitting: Emergency Medicine

## 2022-07-10 ENCOUNTER — Emergency Department (HOSPITAL_COMMUNITY)
Admission: EM | Admit: 2022-07-10 | Discharge: 2022-07-11 | Disposition: A | Discharge status: Home / Self Care | Payer: Medicare Other | Attending: Emergency Medicine | Admitting: Emergency Medicine

## 2022-07-10 ENCOUNTER — Other Ambulatory Visit: Payer: Self-pay

## 2022-07-10 DIAGNOSIS — Z7982 Long term (current) use of aspirin: Secondary | ICD-10-CM | POA: Diagnosis not present

## 2022-07-10 DIAGNOSIS — K8020 Calculus of gallbladder without cholecystitis without obstruction: Secondary | ICD-10-CM | POA: Diagnosis not present

## 2022-07-10 DIAGNOSIS — R0789 Other chest pain: Secondary | ICD-10-CM | POA: Diagnosis present

## 2022-07-10 DIAGNOSIS — Z79899 Other long term (current) drug therapy: Secondary | ICD-10-CM | POA: Insufficient documentation

## 2022-07-10 DIAGNOSIS — R059 Cough, unspecified: Secondary | ICD-10-CM | POA: Insufficient documentation

## 2022-07-10 DIAGNOSIS — I10 Essential (primary) hypertension: Secondary | ICD-10-CM | POA: Insufficient documentation

## 2022-07-10 LAB — COMPREHENSIVE METABOLIC PANEL
ALT: 53 U/L — ABNORMAL HIGH (ref 0–44)
AST: 71 U/L — ABNORMAL HIGH (ref 15–41)
Albumin: 3.6 g/dL (ref 3.5–5.0)
Alkaline Phosphatase: 235 U/L — ABNORMAL HIGH (ref 38–126)
Anion gap: 8 (ref 5–15)
BUN: 15 mg/dL (ref 8–23)
CO2: 27 mmol/L (ref 22–32)
Calcium: 9.9 mg/dL (ref 8.9–10.3)
Chloride: 103 mmol/L (ref 98–111)
Creatinine, Ser: 0.51 mg/dL (ref 0.44–1.00)
GFR, Estimated: >60 mL/min (ref >60)
Glucose, Bld: 119 mg/dL — ABNORMAL HIGH (ref 70–99)
Potassium: 4.3 mmol/L (ref 3.5–5.1)
Sodium: 138 mmol/L (ref 135–145)
Total Bilirubin: 1.3 mg/dL — ABNORMAL HIGH (ref 0.3–1.2)
Total Protein: 6.7 g/dL (ref 6.5–8.1)

## 2022-07-10 LAB — URINALYSIS, ROUTINE W REFLEX MICROSCOPIC
Bilirubin Urine: NEGATIVE
Glucose, UA: NEGATIVE mg/dL
Hgb urine dipstick: NEGATIVE
Ketones, ur: NEGATIVE mg/dL
Nitrite: NEGATIVE
Protein, ur: NEGATIVE mg/dL
Specific Gravity, Urine: 1.018 (ref 1.005–1.030)
pH: 5 (ref 5.0–8.0)

## 2022-07-10 LAB — CBC
HCT: 38.2 % (ref 36.0–46.0)
Hemoglobin: 12.5 g/dL (ref 12.0–15.0)
MCH: 29.7 pg (ref 26.0–34.0)
MCHC: 32.7 g/dL (ref 30.0–36.0)
MCV: 90.7 fL (ref 80.0–100.0)
Platelets: 136 10*3/uL — ABNORMAL LOW (ref 150–400)
RBC: 4.21 MIL/uL (ref 3.87–5.11)
RDW: 16.3 % — ABNORMAL HIGH (ref 11.5–15.5)
WBC: 5.1 10*3/uL (ref 4.0–10.5)
nRBC: 0 % (ref 0.0–0.2)

## 2022-07-10 LAB — TROPONIN I (HIGH SENSITIVITY): Troponin I (High Sensitivity): 14 ng/L (ref <18)

## 2022-07-10 MED ORDER — ONDANSETRON 4 MG PO TBDP
4.0000 mg | ORAL_TABLET | Freq: Once | ORAL | Status: AC
  Administered 2022-07-10: 4 mg via ORAL
  Filled 2022-07-10: qty 1

## 2022-07-10 MED ORDER — OXYCODONE-ACETAMINOPHEN 5-325 MG PO TABS
2.0000 | ORAL_TABLET | Freq: Once | ORAL | Status: AC
  Administered 2022-07-10: 2 via ORAL
  Filled 2022-07-10: qty 2

## 2022-07-10 NOTE — ED Provider Triage Note (Signed)
  Emergency Medicine Provider Triage Evaluation Note  MRN:  [id number]  Arrival date & time: 07/10/22    Medically screening exam initiated at 10:12 PM.   CC:   Chest Pain   HPI:  [name] [name] is a 66 y.o. year-old female presents to the ED with chief complaint of right sided CP.  Onset today.  Worse with deep breathing.  Has had some cough.  Denies fever.  Reports associate nausea, but no vomiting.  Denies dysuria.  Has felt this before when she had pneumonia.  History provided by patient. ROS:  -As included in HPI PE:   Vitals:   07/10/22 2208  BP: (!) 147/73  Pulse: (!) 112  Temp: 98.9 F (37.2 C)  SpO2: 96%    Non-toxic appearing No respiratory distress tachycardic MDM:  Based on signs and symptoms, CAP is highest on my differential, followed by gallbladder disease. I've ordered labs and imaging in triage to expedite lab/diagnostic workup.  Patient was informed that the remainder of the evaluation will be completed by another provider, this initial triage assessment does not replace that evaluation, and the importance of remaining in the ED until their evaluation is complete.    Montine Circle, PA-C 07/10/22 2213

## 2022-07-10 NOTE — ED Triage Notes (Signed)
Pt reported to ED with c/o right lower chest pain that radiates into back and shortness of breath. Pt states symptoms began approximately 45 minutes ago. Denies nausea/vomiting.

## 2022-07-11 DIAGNOSIS — K8020 Calculus of gallbladder without cholecystitis without obstruction: Secondary | ICD-10-CM | POA: Diagnosis not present

## 2022-07-11 LAB — TROPONIN I (HIGH SENSITIVITY): Troponin I (High Sensitivity): 16 ng/L (ref <18)

## 2022-07-11 MED ORDER — ACETAMINOPHEN 325 MG PO TABS
650.0000 mg | ORAL_TABLET | Freq: Once | ORAL | Status: AC
  Administered 2022-07-11: 650 mg via ORAL
  Filled 2022-07-11: qty 2

## 2022-07-11 MED ORDER — IBUPROFEN 400 MG PO TABS
600.0000 mg | ORAL_TABLET | Freq: Once | ORAL | Status: AC
  Administered 2022-07-11: 600 mg via ORAL
  Filled 2022-07-11: qty 1

## 2022-07-11 NOTE — Discharge Instructions (Signed)
You were seen in the emergency department for your right lower chest pain.  This appears to be coming from your gallbladder as your ultrasound shows that you have gallstones in your gallbladder.  There were no signs of gallbladder infection.  Your cardiac work-up was normal and your chest x-ray showed no signs of pneumonia or fluid on your lungs.  You can take Tylenol or Motrin as needed for your pain and you should follow-up with a general surgeon for further management of your gallstones.  You should return to the emergency department if you have significantly worsening pain, you have repetitive vomiting and cannot keep down any water or medications, you have fevers, or if you have any other new or concerning symptoms.

## 2022-07-11 NOTE — ED Provider Notes (Signed)
[facility] EMERGENCY DEPARTMENT Provider Note   CSN: [id number] Arrival date & time: 07/10/22  2153     History  Chief Complaint  Patient presents with   Chest Pain    [name] [name] is a 66 y.o. female.  Patient is a 66 year old female with a past medical history of myotonic dystrophy, hypertension, hyperlipidemia, fatty liver presenting to the emergency department with right lower chest pain.  She states that the pain started around 8:00 last night.  She states that she did have fried fish for dinner and was at rest at the time the pain started.  She states that the pain has been constant since it started.  She states that she has chronic nausea and had no worsening nausea with the pain and denies any vomiting.  She denies any fevers or chills.  She denies any shortness of breath.  She states that she has had a mild productive cough.  She denies any diarrhea and states she has chronic constipation.  She denies any dysuria or hematuria.  She denies any prior abdominal surgeries.   Chest Pain      Home Medications Prior to Admission medications   Medication Sig Start Date End Date Taking? Authorizing Provider  ALPRAZolam Duanne Moron) 0.5 MG tablet Take 0.5 mg by mouth at bedtime as needed for anxiety.    [provider]  aspirin EC 81 MG tablet Take 81 mg by mouth daily. Swallow whole.    [provider]  b complex vitamins capsule Take 1 capsule by mouth daily.    [provider]  Bacillus Coagulans-Inulin (ALIGN PREBIOTIC-PROBIOTIC PO) Take by mouth.    [provider]  calcium-vitamin D 250-100 MG-UNIT per tablet Take 1 tablet by mouth 2 (two) times daily.    [provider]  cetirizine (ZYRTEC) 10 MG tablet Take 10 mg by mouth daily.    [provider]  Cobalamin Combinations (NEURIVA PLUS) CAPS Take 1 capsule by mouth daily.    [provider]  Collagen-Vitamin C-Biotin (COLLAGEN 1500/C PO) Take by  mouth.    [provider]  cyanocobalamin 1000 MCG tablet Take by mouth.    [provider]  losartan-hydrochlorothiazide (HYZAAR) 100-12.5 MG tablet Take 1 tablet by mouth daily. 10/05/20   [provider]  Multiple Vitamin (MULTI-VITAMIN) tablet Take by mouth.    [provider]  NON FORMULARY Take 1-4 capsules by mouth as needed. TERRA ZYME    [provider]  pantoprazole (PROTONIX) 40 MG tablet Take 40 mg by mouth daily.    [provider]  traMADol (ULTRAM) 50 MG tablet Take by mouth every 6 (six) hours as needed. Take two twice a day    [provider]  Turmeric 450 MG CAPS Take 1 capsule by mouth daily.    [provider]  zolpidem (AMBIEN) 10 MG tablet Take 10 mg by mouth at bedtime as needed for sleep.    [provider]      Allergies    Doxycycline    Review of Systems   Review of Systems  Cardiovascular:  Positive for chest pain.    Physical Exam Updated Vital Signs BP 107/60 (BP Location: Right Arm)   Pulse 80   Temp 97.7 F (36.5 C) (Oral)   Resp 16   SpO2 100%  Physical Exam Vitals and nursing note reviewed.  Constitutional:      General: She is not in acute distress.    Appearance: She  is well-developed.  HENT:     Head: Normocephalic and atraumatic.  Eyes:     Extraocular Movements: Extraocular movements intact.  Cardiovascular:     Rate and Rhythm: Normal rate and regular rhythm.     Heart sounds: Normal heart sounds.  Pulmonary:     Effort: Pulmonary effort is normal.     Breath sounds: Normal breath sounds.  Chest:     Chest wall: No tenderness.  Abdominal:     Palpations: Abdomen is soft.     Tenderness: There is abdominal tenderness (Mild right upper quadrant tenderness without rebound or guarding).  Musculoskeletal:        General: Normal range of motion.     Cervical back: Normal range of motion and neck supple.  Skin:    General: Skin is warm and dry.   Neurological:     General: No focal deficit present.     Mental Status: She is alert and oriented to person, place, and time.  Psychiatric:        Mood and Affect: Mood normal.        Behavior: Behavior normal.     ED Results / Procedures / Treatments   Labs (all labs ordered are listed, but only abnormal results are displayed) Labs Reviewed  CBC - Abnormal; Notable for the following components:      Result Value   RDW 16.3 (*)    Platelets 136 (*)    All other components within normal limits  URINALYSIS, ROUTINE W REFLEX MICROSCOPIC - Abnormal; Notable for the following components:   Color, Urine AMBER (*)    APPearance HAZY (*)    Leukocytes,Ua SMALL (*)    Bacteria, UA FEW (*)    All other components within normal limits  COMPREHENSIVE METABOLIC PANEL - Abnormal; Notable for the following components:   Glucose, Bld 119 (*)    AST 71 (*)    ALT 53 (*)    Alkaline Phosphatase 235 (*)    Total Bilirubin 1.3 (*)    All other components within normal limits  TROPONIN I (HIGH SENSITIVITY)  TROPONIN I (HIGH SENSITIVITY)    EKG EKG Interpretation  Date/Time:  Monday July 10 2022 22:12:40 EDT Ventricular Rate:  110 PR Interval:  112 QRS Duration: 110 QT Interval:  344 QTC Calculation: 465 R Axis:   50 Text Interpretation: Sinus tachycardia Anterior infarct , age undetermined Abnormal ECG When compared with ECG of 21-Aug-2021 13:19, PREVIOUS ECG IS PRESENT since last tracing no significant change Confirmed by Oneal Deputy [id number]) on 07/11/2022 8:19:49 AM  Radiology US Abdomen Limited  Result Date: 07/10/2022 CLINICAL DATA:  Right upper quadrant pain EXAM: ULTRASOUND ABDOMEN LIMITED RIGHT UPPER QUADRANT COMPARISON:  09/23/2021 FINDINGS: Gallbladder: Gallbladder is decompressed with wall echo shadow complex consistent with cholelithiasis in a decompressed gallbladder. Sonographic Murphy's sign could not be elicited due to underlying pain medication administration. Common  bile duct: Diameter: 3.5 mm. Liver: Diffusely increased in echogenicity consistent with fatty infiltration. No focal mass is noted. Portal vein is patent on color Doppler imaging with normal direction of blood flow towards the liver. Other: None. IMPRESSION: Predominately decompressed gallbladder with gallstones within. Fatty infiltration of the liver. Electronically Signed   By: Inez Catalina M.D.   On: 07/10/2022 22:55   DG Chest 2 View  Result Date: 07/10/2022 CLINICAL DATA:  Right upper quadrant pain, chest pain. EXAM: CHEST - 2 VIEW COMPARISON:  06/23/2022. FINDINGS: The heart size and mediastinal contours are within normal limits.  There is atherosclerotic calcification of the aorta. No consolidation, effusion, or pneumothorax. Degenerative changes are present in the thoracic spine. IMPRESSION: No active cardiopulmonary disease. Electronically Signed   By: Brett Fairy M.D.   On: 07/10/2022 22:36    Procedures Procedures    Medications Ordered in ED Medications  ibuprofen (ADVIL) tablet 600 mg (has no administration in time range)  acetaminophen (TYLENOL) tablet 650 mg (has no administration in time range)  oxyCODONE-acetaminophen (PERCOCET/ROXICET) 5-325 MG per tablet 2 tablet (2 tablets Oral Given 07/10/22 2215)  ondansetron (ZOFRAN-ODT) disintegrating tablet 4 mg (4 mg Oral Given 07/10/22 2215)    ED Course/ Medical Decision Making/ A&P                           Medical Decision Making Patient is a 66 year old female with a past medical history of myotonic dystrophy, hypertension, hyperlipidemia and fatty liver presenting to the emergency department with right lower chest pain.  The patient was initially evaluated by triage and had labs, chest x-ray and ultrasound performed overnight to evaluate for ACS, pneumonia, pulmonary effusion, pneumothorax, cholelithiasis, cholecystitis, hepatitis or pancreatitis as causes of her pain.  The patient's chest x-ray is negative for acute disease.   Labs and EKG interpreted by myself showed mild elevation of AST and ALT as well as alk phos which have been elevated on her previous labs in the setting of her fatty liver.  EKG is without acute ischemic changes and troponin is negative making ACS unlikely.  The patient had a right upper quadrant ultrasound performed which showed cholelithiasis without cholecystitis.  Although she has mildly elevated LFTs, she had no CBD dilation making choledocholithiasis unlikely.  The patient's pain and nausea are well controlled at this time.  Her symptoms are likely secondary to cholelithiasis.  She is stable for discharge with outpatient surgery follow-up and was given strict return precautions.  Amount and/or Complexity of Data Reviewed External Data Reviewed: labs.    Details: Prior labs with mildly elevated transaminases and alk phos Labs: ordered. Decision-making details documented in ED Course. Radiology: ordered. Decision-making details documented in ED Course. ECG/medicine tests: ordered. Decision-making details documented in ED Course.  Risk OTC drugs.   Patient well appearing and tolerating PO, pain well controlled. She is stable for discharge with strict return precautions and was given general surgery follow up.        Final Clinical Impression(s) / ED Diagnoses Final diagnoses:  Calculus of gallbladder without cholecystitis without obstruction    Rx / DC Orders ED Discharge Orders     None         Ottie Glazier, DO 07/11/22 [phone]

## 2022-07-12 ENCOUNTER — Other Ambulatory Visit (HOSPITAL_COMMUNITY): Payer: Self-pay | Admitting: Obstetrics and Gynecology

## 2022-07-12 ENCOUNTER — Other Ambulatory Visit: Payer: Self-pay | Admitting: Obstetrics and Gynecology

## 2022-07-12 ENCOUNTER — Telehealth: Payer: Self-pay

## 2022-07-12 DIAGNOSIS — E041 Nontoxic single thyroid nodule: Secondary | ICD-10-CM

## 2022-07-12 DIAGNOSIS — R928 Other abnormal and inconclusive findings on diagnostic imaging of breast: Secondary | ICD-10-CM

## 2022-07-12 NOTE — Telephone Encounter (Signed)
Called and spoke to patient she voiced understanding

## 2022-07-12 NOTE — Telephone Encounter (Signed)
Her troponin was not elevated

## 2022-07-13 ENCOUNTER — Other Ambulatory Visit (HOSPITAL_COMMUNITY): Payer: Self-pay | Admitting: Obstetrics and Gynecology

## 2022-07-13 DIAGNOSIS — E041 Nontoxic single thyroid nodule: Secondary | ICD-10-CM

## 2022-07-15 ENCOUNTER — Ambulatory Visit: Payer: Medicare Other

## 2022-07-15 ENCOUNTER — Ambulatory Visit
Admission: RE | Admit: 2022-07-15 | Discharge: 2022-07-15 | Disposition: A | Discharge status: Home / Self Care | Payer: Medicare Other | Source: Ambulatory Visit | Attending: Obstetrics and Gynecology | Admitting: Obstetrics and Gynecology

## 2022-07-15 DIAGNOSIS — R928 Other abnormal and inconclusive findings on diagnostic imaging of breast: Secondary | ICD-10-CM

## 2022-07-18 ENCOUNTER — Ambulatory Visit (HOSPITAL_COMMUNITY)
Admission: RE | Admit: 2022-07-18 | Discharge: 2022-07-18 | Disposition: A | Discharge status: Home / Self Care | Payer: Medicare Other | Source: Ambulatory Visit | Attending: Obstetrics and Gynecology | Admitting: Obstetrics and Gynecology

## 2022-07-18 DIAGNOSIS — E041 Nontoxic single thyroid nodule: Secondary | ICD-10-CM | POA: Insufficient documentation

## 2022-07-21 ENCOUNTER — Encounter: Payer: Self-pay | Admitting: Cardiology

## 2022-07-21 ENCOUNTER — Ambulatory Visit: Payer: Medicare Other | Admitting: Cardiology

## 2022-07-21 VITALS — BP 131/62 | HR 100 | Temp 98.5°F | Resp 16 | Ht 66.0 in | Wt 145.0 lb

## 2022-07-21 DIAGNOSIS — I10 Essential (primary) hypertension: Secondary | ICD-10-CM

## 2022-07-21 DIAGNOSIS — G7111 Myotonic muscular dystrophy: Secondary | ICD-10-CM

## 2022-07-21 NOTE — Progress Notes (Signed)
Primary Physician/Referring:  Michael Boston, MD  Patient ID: [name] [name], female    DOB: Nov 05, 1956, 66 y.o.   MRN: [id number]  Chief Complaint  Patient presents with   Annual Exam   Hypertension   HPI:    [name] [name]  is a 66 y.o. 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  She remains asymptomatic without dyspnea, cough, leg edema or chest pain or palpitations.  Her myotonic dystrophy is gradually getting worse, 3 years ago I had seen her walking with a cane, presently she is using a walker.    Patient was seen in the emergency room on 07/30/2022 with right lower chest pain, she was ruled out for myocardial infarction, it was felt her pain was secondary to chronic cholecystitis without obstruction and discharged home.  She now presents for follow-up.  She is presently asymptomatic except she gets back pain when she eats also feels nauseous and has been losing weight due to this.   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   Pituitary tumor    Recurrent upper respiratory infection (URI)    Tinnitus    Vitamin B deficiency    Past Surgical History:  Procedure Laterality Date   BACK SURGERY     Family History  Problem Relation Age of Onset   Lupus Mother    Heart attack Mother    Heart attack Father     Social History   Tobacco Use   Smoking status: Former    Packs/day: 0.50    Years: 20.00    Total pack years: 10.00    Types: Cigarettes    Quit date: 12/11/1988    Years since quitting: 33.6   Smokeless tobacco: Never  Substance Use Topics   Alcohol use: Not Currently    Alcohol/week: 1.0 standard drink of alcohol    Types: 1 Glasses of wine per week     Comment: Rare   Marital Status: Divorced  ROS  Review of Systems  Cardiovascular:  Negative for chest pain, dyspnea on exertion and leg swelling.  Musculoskeletal:  Positive for muscle weakness.  Gastrointestinal:  Negative for melena.   Objective  Blood pressure 131/62, pulse 100, temperature 98.5 F (36.9 C), temperature source Temporal, resp. rate 16, height '5\' 6"'$  (1.676 m), weight 145 lb (65.8 kg), SpO2 96 %. Body mass index is 23.4 kg/m.     07/21/2022    1:21 PM 07/11/2022    8:45 AM 07/11/2022    7:43 AM  Vitals with BMI  Height '5\' 6"'$     Weight 145 lbs    BMI 43.15    Systolic 400 867 619  Diastolic 62 70 60  Pulse 509 73 80     Physical Exam Neck:     Vascular: No carotid bruit or JVD.  Cardiovascular:     Rate and Rhythm: Normal rate and regular rhythm.     Pulses: Intact distal pulses.     Heart sounds: Normal heart sounds. No murmur heard.    No gallop.  Pulmonary:     Effort: Pulmonary effort is normal.     Breath sounds: Normal breath sounds.  Abdominal:     General: Bowel sounds are normal.     Palpations: Abdomen is soft.  Musculoskeletal:     Right lower leg: No edema.     Left lower leg: No edema.    Laboratory examination:      Latest Ref Rng & Units 07/10/2022   10:15 PM 08/21/2021    6:25 PM 08/21/2021    1:47 PM  CMP  Glucose 70 - 99 mg/dL 119   101   BUN 8 - 23 mg/dL 15   17   Creatinine 0.44 - 1.00 mg/dL 0.51   0.53   Sodium 135 - 145 mmol/L 138   138   Potassium 3.5 - 5.1 mmol/L 4.3   4.8   Chloride 98 - 111 mmol/L 103   102   CO2 22 - 32 mmol/L 27   28   Calcium 8.9 - 10.3 mg/dL 9.9   9.6   Total Protein 6.5 - 8.1 g/dL 6.7  6.8    Total Bilirubin 0.3 - 1.2 mg/dL 1.3  1.8    Alkaline Phos 38 - 126 U/L 235  118    AST 15 - 41 U/L 71  74    ALT 0 - 44 U/L 53  59        Latest Ref Rng & Units 07/10/2022   10:15 PM 12/27/2021   10:49 AM 08/21/2021    1:47 PM  CBC  WBC 4.0 - 10.5 K/uL 5.1  4.9  4.9   Hemoglobin 12.0 - 15.0 g/dL 12.5   16.5  15.6   Hematocrit 36.0 - 46.0 % 38.2  48.0  47.9   Platelets 150 - 400 K/uL 136   135    External labs 07/14/2019: Total Cholesterol 25 - 199 MG/DL 184    Triglycerides 10 - 150 MG/DL 120    HDL Cholesterol 35 - 135 MG/DL 67    LDL Cholesterol Calculated MG/DL 93     Medications and allergies   Allergies  Allergen Reactions   Doxycycline Shortness Of Breath     FINAL MEDICATION AS OF TODAY:    Current Outpatient Medications:    ALPRAZolam (XANAX) 0.5 MG tablet, Take 0.5 mg by mouth at bedtime as needed for anxiety., Disp: , Rfl:    aspirin EC 81 MG tablet, Take 81 mg by mouth daily. Swallow whole., Disp: , Rfl:    b complex vitamins capsule, Take 1 capsule by mouth daily., Disp: , Rfl:    calcium-vitamin D 250-100 MG-UNIT per tablet, Take 1 tablet by mouth 2 (two) times daily., Disp: , Rfl:    cetirizine (ZYRTEC) 10 MG tablet, Take 10 mg by mouth daily., Disp: , Rfl:    cyanocobalamin 1000 MCG tablet, Take by mouth., Disp: , Rfl:    losartan-hydrochlorothiazide (HYZAAR) 100-12.5 MG tablet, Take 1 tablet by mouth daily., Disp: , Rfl:    Multiple Vitamin (MULTI-VITAMIN) tablet, Take by mouth., Disp: , Rfl:    omeprazole (PRILOSEC) 20 MG capsule, Take 20 mg by mouth daily., Disp: , Rfl:    traMADol (ULTRAM) 50 MG tablet, Take by mouth every 6 (six) hours as needed. Take two twice a day, Disp: , Rfl:    zolpidem (AMBIEN) 10 MG tablet, Take 10 mg by mouth at bedtime as needed for sleep., Disp: , Rfl:    Cobalamin Combinations (NEURIVA PLUS) CAPS, Take 1 capsule by mouth daily., Disp: , Rfl:    Collagen-Vitamin C-Biotin (COLLAGEN 1500/C PO), Take by mouth., Disp: , Rfl:  NON FORMULARY, Take 1-4 capsules by mouth as needed. TERRA ZYME, Disp: , Rfl:    pantoprazole (PROTONIX) 40 MG tablet, Take 40 mg by mouth daily., Disp: , Rfl:    Turmeric 450 MG CAPS, Take 1 capsule by mouth daily., Disp: , Rfl:    Radiology:   No results found.  Cardiac Studies:   PCV ECHOCARDIOGRAM  COMPLETE 05/19/2021  Narrative Echocardiogram 05/19/2021: Normal LV systolic function with visual EF 60-65%. Left ventricle cavity is normal in size. Normal global wall motion. Normal diastolic filling pattern, normal LAP. Mild (Grade I) mitral regurgitation. Mild tricuspid regurgitation. No evidence of pulmonary hypertension. Compared to study dated 02/01/2017 no significant change.    EKG:    EKG 07/21/2022: Normal sinus rhythm with rate of 89 bpm, normal axis, poor R progression, cannot exclude anteroseptal infarct old.  IVCD, borderline criteria for LVH.  Nonspecific T abnormality.  Compared to 07/10/2022, no significant change.  Assessment     ICD-10-CM   1. Primary hypertension  I10 EKG 12-Lead    2. 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  Orders Placed This Encounter  Procedures   EKG 12-Lead   Recommendations:   [name] [name] is a 66 y.o. 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  I had seen her 3 years ago.  She remains asymptomatic without dyspnea, cough, leg edema or chest pain or palpitations.  Patient was seen in the emergency room on 07/30/2022 with right lower chest pain, she was ruled out for myocardial infarction, it was felt her pain was secondary to chronic cholecystitis without obstruction and discharged home.  She now presents for follow-up.  She is presently asymptomatic except she gets back pain when she eats also feels nauseous and has been losing weight due to this.  She also has right hypochondrial discomfort as well.  Physical examination is essentially normal.  No changes from the EKG that was seen from the emergency room,  she has high lateral T wave inversion related to chronic hypertension which is well controlled.  Lipids are normal although performed in 2020.  She is extremely low risk from cardiac standpoint and to have coronary artery disease.  I will try to get her an earlier appointment with surgery to evaluate her for chronic cholecystitis and possible need for cholecystectomy.  Otherwise stable from cardiac standpoint, her echocardiogram reveals normal LV systolic function which is unchanged from 2018, hence although she has myotonic dystrophy, there is no involvement of the myocardium.  I will see her back on a as needed basis.    Adrian Prows, MD, [facility] 07/21/2022, 2:30 PM Office: [phone]

## 2022-07-26 ENCOUNTER — Encounter: Payer: Self-pay | Admitting: Neurology

## 2022-07-26 ENCOUNTER — Ambulatory Visit (INDEPENDENT_AMBULATORY_CARE_PROVIDER_SITE_OTHER): Payer: Medicare Other | Admitting: Neurology

## 2022-07-26 VITALS — BP 99/58 | HR 98 | Ht 66.0 in | Wt 147.0 lb

## 2022-07-26 DIAGNOSIS — G7111 Myotonic muscular dystrophy: Secondary | ICD-10-CM

## 2022-07-26 DIAGNOSIS — D352 Benign neoplasm of pituitary gland: Secondary | ICD-10-CM | POA: Diagnosis not present

## 2022-07-26 NOTE — Progress Notes (Signed)
Chief Complaint  Patient presents with   Follow-up    Room 12 - alone. Yearly follow up for myotonic muscular dystrophy type II. She uses a rolling walker for assistance.    ASSESSMENT AND PLAN  [name] is a 66 y.o. female   1.  Myotonic muscular dystrophy type II -Getting progressively worse, increased weakness, gait abnormality, moderate lower extremity weakness -Discussed physical therapy, is getting ready to go to [place] for several months, wishes to hold off -Order for light weight rolling walker -Recent concern for gallbladder issues, seeing GI next week, her condition can affect the pancreas, in ER mildly elevated liver enzymes, alk phos -Abnormal EMG, myotonic discharge -Reportedly diagnosis was confirmed by genetic testing -I will see her back in 6 months, we can revisit PT/OT at that time  2.  Pituitary microadenoma, stable pituitary focus 6 x 7 mm (August 2022 MRI) 3.  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  Sensory neuronal hearing loss -Continue aspirin 81 mg daily   DIAGNOSTIC DATA (LABS, IMAGING, TESTING) - I reviewed patient records, labs, notes, testing and imaging myself where available.  Laboratory evaluations in November 2020: CBC, hemoglobin of 15.5, BMP, creatinine of 0.34, lipid panel, LDL of 93, normal TSH 1.5,  ECHO in June 2022: 1. Normal LV systolic function with visual EF 60-65%. Left ventricle cavity is normal in size. Normal global wall motion. Normal diastolic filling pattern, normal LAP. 2. Mild (Grade I) mitral regurgitation. 3. Mild tricuspid regurgitation. No evidence of pulmonary hypertension. 4. Compared to study dated 02/01/2017 no significant change.   EMG evaluation on the right arm and right leg shows diffuse proximal and distal mild myotonic discharges. Of note, on clinical examination, there is a delay in relaxation with grip on both hands, with repeated gripping, the stiffness improves. Given the EMG and  clinical findings, possibility of a myotonic muscle disorder such as myotonia congenita (Thompson's disease) needs to be considered. Myotonic dystrophy is likely less likely.The patient reports no temperature sensitivity with the muscle stiffness, making paramyotonia congenita less likely.   MEDICAL HISTORY:  [name] [name] is a 66 year old female, to continue neurologic care for myotonic muscular dystrophy type II, her primary care physician is Dr. Jacalyn Lefevre, Jesse Sans, MD, she has been a patient of Dr. Jannifer Franklin for many years, last visit was in July 2021  I reviewed and summarized the referring note.  Past medical history Hypertension Chronic insomnia Anxiety, xanax prn.  Around age 31s, she began to have difficulty climbing up stairs, gradually getting worse, eventually was diagnosed with myotonic muscular dystrophy type II based on abnormal EMG findings,  EMG in May 2016 by Dr. Jannifer Franklin, described myotonic discharges,   Per patient and Dr. Jannifer Franklin record, genetic testing was done and confirmed the presence of myotonic dystrophy type II,   She complains of diffuse body achy pain, taking tramadol 94m bid as needed, worsening gait abnormalities, used cane for a while, use walker since 2020, still driving.   MRI brain w/wo in Jan 2012  Stable 3 x 5 x 7 mm area of differential enhancement in the left posterior paramedian pituitary consistent with a small microadenoma or pars intermedia cyst.  Otherwise negative brain.  MRI scan of the cervical spine in April 2016 spondylitic changes from C4-C7 resulting in mild canal narrowing  and bilateral foraminal narrowing most severe at C6-7 but without  any cord signal abnormality.   Update July 26, 2022 SS: MRI of the brain  with and without contrast in August 2022 showed moderate small vessel disease, stable posterior pituitary focus 6 x 7 mm.  On aspirin 81 mg daily. Walking is worsening, relies on walker, weakness in legs. Lives alone, is divorced. Does  everything on her own, takes her time, walker is heavy to put in car. Claims last several months, not much appetite, lost about 20 lbs, since PNA on antibiotics several months ago. Needs gallbladder removed, seeing GI next week. When she eats feels nauseated. In ER 07/10/22 for right lower CP. US showed cholelithiasis without cholecystitis. AST 71, AST 53, Alk phos 235.  PHYSICAL EXAM:   Vitals:   07/26/22 0958  BP: (!) 99/58  Pulse: 98  Weight: 147 lb (66.7 kg)  Height: _0  (1.676 m)    Not recorded    Body mass index is 23.73 kg/m.  Physical Exam  General: The patient is alert and cooperative at the time of the examination.  Skin: No significant peripheral edema is noted.  Neurologic Exam  Mental status: The patient is alert and oriented x 3 at the time of the examination. The patient has apparent normal recent and remote memory, with an apparently normal attention span and concentration ability.  Hard of hearing.  Cranial nerves: Facial symmetry is present. Speech is normal, no aphasia or dysarthria is noted. Extraocular movements are full. Visual fields are full.  Motor: Good strength of upper extremities 5/5, with lowers 3/5 bilateral hip flexion 4/5 bilateral knee extension/flexion, 4/5 bilateral dorsiflexion  Sensory examination: Soft touch sensation is symmetric on the face, arms, and legs.  Coordination: The patient has good finger-nose-finger bilaterally. Is hard to lift the legs for heel to shin  Gait and station: Position to stand, relies on a walker, forward leaning unsteady  Reflexes: Deep tendon reflexes are symmetric, but decreased   REVIEW OF SYSTEMS:  Full 14 system review of systems performed and notable only for as above  See HPI  ALLERGIES: Allergies  Allergen Reactions   Doxycycline Shortness Of Breath    HOME MEDICATIONS: Current Outpatient Medications  Medication Sig Dispense Refill   albuterol (VENTOLIN HFA) 108 (90 Base) MCG/ACT inhaler  Inhale 2 puffs into the lungs every 4 (four) hours as needed for wheezing or shortness of breath.     ALPRAZolam (XANAX) 0.5 MG tablet Take 0.5 mg by mouth at bedtime as needed for anxiety.     aspirin EC 81 MG tablet Take 81 mg by mouth daily. Swallow whole.     b complex vitamins capsule Take 1 capsule by mouth daily.     calcium-vitamin D 250-100 MG-UNIT per tablet Take 1 tablet by mouth 2 (two) times daily.     cetirizine (ZYRTEC) 10 MG tablet Take 10 mg by mouth daily.     cyanocobalamin 1000 MCG tablet Take by mouth.     losartan-hydrochlorothiazide (HYZAAR) 100-12.5 MG tablet Take 1 tablet by mouth daily.     Multiple Vitamin (MULTI-VITAMIN) tablet Take by mouth.     omeprazole (PRILOSEC) 20 MG capsule Take 20 mg by mouth daily.     pantoprazole (PROTONIX) 40 MG tablet Take 40 mg by mouth daily.     traMADol (ULTRAM) 50 MG tablet Take by mouth every 6 (six) hours as needed. Take two twice a day     zolpidem (AMBIEN) 10 MG tablet Take 10 mg by mouth at bedtime as needed for sleep.     No current facility-administered medications for this visit.    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   Pituitary tumor    Recurrent upper respiratory infection (URI)    Tinnitus    Vitamin B deficiency     PAST SURGICAL HISTORY: Past Surgical History:  Procedure Laterality Date   BACK SURGERY      FAMILY HISTORY: Family History  Problem Relation Age of Onset   Lupus Mother    Heart attack Mother    Heart attack Father     SOCIAL HISTORY: Social History   Socioeconomic History   Marital status: Divorced    Spouse name: Not on file   Number of children: 1   Years of education: Some colg   Highest education level: Not on file  Occupational History   Occupation: Unemployeed  Tobacco  Use   Smoking status: Former    Packs/day: 0.50    Years: 20.00    Total pack years: 10.00    Types: Cigarettes    Quit date: 12/11/1988    Years since quitting: 33.6   Smokeless tobacco: Never  Substance and Sexual Activity   Alcohol use: Not Currently    Alcohol/week: 1.0 standard drink of alcohol    Types: 1 Glasses of wine per week    Comment: Rare   Drug use: No   Sexual activity: Not on file  Other Topics Concern   Not on file  Social History Narrative   1 caffeine drink a day   Patient is right handed.    Social Determinants of Health   Financial Resource Strain: Not on file  Food Insecurity: Not on file  Transportation Needs: Not on file  Physical Activity: Not on file  Stress: Not on file  Social Connections: Not on file  Intimate Partner Violence: Not on file   Butler Denmark, Laqueta Jean, [facility] [address] [place], [place] [address] [phone]

## 2022-07-26 NOTE — Patient Instructions (Addendum)
Follow up with your GI doctor Order light weight walker  See you back in 1 year Continue aspirin 81 mg daily

## 2022-07-31 ENCOUNTER — Telehealth: Payer: Self-pay | Admitting: Pulmonary Disease

## 2022-07-31 NOTE — Telephone Encounter (Signed)
Called and spoke with pt who states she has been coughing getting up phlegm that is clear in color. Pt has been taking cough drops but has not tried any OTC cough meds. Stated to pt that she could try either delsym or Robitussin to see if either of those would help with her cough and she verbalized understanding. Nothing further needed.

## 2022-08-03 ENCOUNTER — Encounter: Payer: Self-pay | Admitting: Internal Medicine

## 2022-08-03 ENCOUNTER — Ambulatory Visit (INDEPENDENT_AMBULATORY_CARE_PROVIDER_SITE_OTHER): Payer: Medicare Other | Admitting: Internal Medicine

## 2022-08-03 ENCOUNTER — Telehealth: Payer: Self-pay | Admitting: Pulmonary Disease

## 2022-08-03 VITALS — BP 116/74 | HR 104 | Temp 97.8°F | Ht 67.0 in | Wt 147.0 lb

## 2022-08-03 DIAGNOSIS — R059 Cough, unspecified: Secondary | ICD-10-CM | POA: Diagnosis not present

## 2022-08-03 DIAGNOSIS — I10 Essential (primary) hypertension: Secondary | ICD-10-CM | POA: Diagnosis not present

## 2022-08-03 DIAGNOSIS — R058 Other specified cough: Secondary | ICD-10-CM | POA: Diagnosis not present

## 2022-08-03 MED ORDER — BENZONATATE 200 MG PO CAPS: 200.0000 mg | ORAL_CAPSULE | Freq: Three times a day (TID) | ORAL | 1 refills | Status: AC | PRN

## 2022-08-03 MED ORDER — METHYLPREDNISOLONE ACETATE 80 MG/ML IJ SUSP
120.0000 mg | Freq: Once | INTRAMUSCULAR | Status: AC
  Administered 2022-08-03: 120 mg via INTRAMUSCULAR

## 2022-08-03 NOTE — Assessment & Plan Note (Signed)
On high dose losartan 08/03/2022 with recurrent cough ? Needs a substitute?   For reasons that may related to vascular permability and nitric oxide pathways but not elevated  bradykinin levels (as seen with  ACEi use) losartan in the generic form has been reported now from mulitple sources  to cause a similar pattern of non-specific  upper airway symptoms as seen with acei.   This has not been reported with exposure to the other ARB's to date, so it seems reasonable for now to try either generic diovan or avapro if ARB needed or use an alternative class altogether.  See:  Lelon Frohlich Allergy Asthma Immunol  2008: 101: p [phone]    Defer to PCP whether to make this change or not depending on response to cough rx as above         Each maintenance medication was reviewed in detail including emphasizing most importantly the difference between maintenance and prns and under what circumstances the prns are to be triggered using an action plan format where appropriate.  Total time for H and P, chart review, counseling,  and generating customized AVS unique to this acute  office visit / same day charting  > 40 min with pt new to me

## 2022-08-03 NOTE — Telephone Encounter (Signed)
Primary Pulmonologist: Dewald Last office visit and with whom: 06/24/2023 What do we see them for (pulmonary problems): Shortness of Breath  Last OV assessment/plan:  Shortness of breath - Plan: DG Chest 2 View   Discussion: [name] [name] is a 65 year old woman, former smoker with myotonic dystrophy and sleep apnea who returns to pulmonary clinic for abnormal CT scan and shortness of breath.    Her chest x-ray today shows bronchial wall thickening but no overt opacities or infiltrates. She had CT Chest from last year showing scattered tree-in-bud infiltrates but was asymptomatic at that time.    She has advair diskus inhaler at home which she is to resume 1 puff twice daily and monitor for symptom improvement.    If she continues to have productive cough we will obtain sputum cultures.   Was appointment offered to patient (explain)?  Yes and scheduled 8/24 11:30a   Reason for call: Cough  (examples of things to ask: : When did symptoms start? 2 weeks ago  Fever? no  Productive? Yes  Color to sputum? Green for past 2 days was white before More sputum than usual? no Wheezing? no Have you needed increased oxygen? Not on any Are you taking your respiratory medications? Albuterol didn't help What over the counter measures have you tried?) delsym and a saline nose rinse nothing helped, would like cough pills  Allergies  Allergen Reactions   Doxycycline Shortness Of Breath    Immunization History  Administered Date(s) Administered   Influenza, Quadrivalent, Recombinant, Inj, Pf 10/05/2020, 08/23/2021   Influenza,inj,Quad PF,6+ Mos 10/13/2019   Influenza-Unspecified 09/10/2018   PFIZER(Purple Top)SARS-COV-2 Vaccination 02/26/2020, 03/22/2020, 10/23/2020   Pneumococcal Polysaccharide-23 03/20/2011, 07/16/2019, 04/29/2021   Tdap 06/01/2009   Zoster, Live 06/29/2020

## 2022-08-03 NOTE — Progress Notes (Signed)
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   Dyspnea:  does fine with rollator  Cough: assoc with lots of nasal d/c clear / worse with coughing / laughing  never when supine/ helps to drink water Sleep:  electric bed slt elevation and does fine p benadryl hs SABA use: don't work   No obvious day to day or daytime pattern/variability or assoc   purulent sputum or mucus plugs or hemoptysis or cp or chest tightness, subjective wheeze or overt sinus or hb symptoms.   Sleeping  without nocturnal  exacerbation  of respiratory  c/o's or need for noct saba. Also denies any obvious fluctuation of symptoms with weather or environmental changes or other aggravating or alleviating factors except as outlined above   No unusual exposure hx or h/o childhood pna/ asthma or knowledge of premature birth.  Current Allergies, Complete Past Medical History, Past Surgical History, Family History, and Social History were reviewed in [employer] record.  ROS  The following are not active complaints unless bolded Hoarseness, sore throat, dysphagia, dental problems, itching, sneezing,  nasal congestion or discharge of excess watery  mucus or purulent secretions, ear ache,   fever, chills, sweats, unintended wt loss or wt gain, classically pleuritic or exertional cp,   orthopnea pnd or arm/hand swelling  or leg swelling, presyncope, palpitations, abdominal pain, anorexia, nausea, vomiting, diarrhea  or change in bowel habits or change in bladder habits, change in stools or change in urine, dysuria, hematuria,  rash, arthralgias, visual complaints, headache, numbness, weakness or ataxia or problems with walking or coordination,  change in mood or  memory.             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     ALPRAZolam (XANAX) 0.5 MG tablet Take 0.5 mg by mouth at bedtime as needed for anxiety.     aspirin EC 81 MG tablet Take 81 mg by mouth daily. Swallow whole.     b complex vitamins capsule Take 1 capsule by mouth daily.     calcium-vitamin D 250-100 MG-UNIT per tablet Take 1 tablet by mouth 2 (two) times daily.     cetirizine (ZYRTEC)  10 MG tablet Take 10 mg by mouth daily.     cyanocobalamin 1000 MCG tablet Take by mouth.     losartan-hydrochlorothiazide (HYZAAR) 100-12.5 MG tablet Take 1 tablet by mouth daily.     Multiple Vitamin (MULTI-VITAMIN) tablet Take by mouth.     omeprazole (PRILOSEC) 20 MG capsule Take 20 mg by mouth daily.     pantoprazole (PROTONIX) 40 MG tablet Take 40 mg by mouth daily.     traMADol (ULTRAM) 50 MG tablet Take by mouth every 6 (six) hours as needed. Take two twice a day     zolpidem (AMBIEN) 10 MG tablet Take 10 mg by mouth at bedtime as needed for sleep.     No facility-administered medications prior to visit.      Objective:     BP 116/74 (BP Location: Left Arm, Patient Position: Sitting, Cuff Size: Normal)   Pulse (!) 104   Temp 97.8 F (36.6 C) (Oral)   Ht '5\' 7"'$  (1.702 m)   Wt 147 lb (66.7 kg)   SpO2 95% Comment: On RA  BMI 23.02 kg/m   SpO2: 95 % (On RA)  Amb with rollator wf nad / cough on isnp   HEENT : Oropharynx  pristine, no pnds     Nasal turbinates nl s excess secretions    NECK :  without  apparent JVD/ palpable Nodes/TM    LUNGS: no acc muscle use,  Nl contour chest which is clear to A and P bilaterally without cough on insp or exp maneuvers   CV:  RRR  no s3 or murmur or increase in P2, and no edema   ABD:  soft and nontender with nl inspiratory excursion in the supine position. No bruits or organomegaly appreciated   MS:    ext warm without deformities Or obvious joint restrictions  calf tenderness, cyanosis or clubbing    SKIN: warm and dry without lesions    NEURO:  alert, approp, nl sensorium    I personally reviewed images and agree with radiology impression as follows:  CXR:   pa and lateral  07/10/22  No active cardiopulmonary disease.    Assessment   Upper airway cough syndrome Onset ? Early 2000s episodic cough flare - worse since June 2023 dx of "pna" in [place] with nl cxr 5/64/33  - cyclical cough regimen started 08/03/2022 >>>  Of the three most common causes of  Sub-acute / recurrent or chronic cough, only one (GERD)  can actually contribute to/ trigger  the other two (asthma and post nasal drip syndrome)  and perpetuate the cylce of cough.  While not intuitively obvious, many patients with chronic low grade reflux do not cough until there is a primary insult that disturbs the protective epithelial barrier and exposes sensitive nerve endings.   This is typically viral but can due to PNDS and  either may apply here.     >>> The point is that once this occurs, it is difficult to eliminate the cycle  using anything but a maximally effective  acid suppression regimen at least in the short run, accompanied by an appropriate diet to address non acid GERD and control / eliminate the cough itself for at least 3 days with tessalon and eliminate pnds with 1st gen H1 blockers per guidelines  And  also so added  depomedrol 120 mg IM in case of component of Th-2 driven upper or lower airways inflammation (if cough responds short term only to relapse before return while  will on full rx for uacs (as above), then  that would point to allergic rhinitis/ asthma or eos bronchitis as alternative dx)   F/u with Dr [name] [name] as planned, return to this clinic as needed   Essential hypertension On high dose losartan 08/03/2022 with recurrent cough ? Needs a substitute?   For reasons that may related to vascular permability and nitric oxide pathways but not elevated  bradykinin levels (as seen with  ACEi use) losartan in the generic form has been reported now from mulitple sources  to cause a similar pattern of non-specific  upper airway symptoms as seen with acei.   This has not been reported with exposure to the other ARB's to date, so it seems reasonable for now to try either generic diovan or avapro if ARB needed or use an alternative class altogether.  See:  Lelon Frohlich Allergy Asthma Immunol  2008: 101: p [phone]    Defer to PCP whether to make this change or not depending on response to cough rx as above         Each maintenance medication was reviewed in detail including emphasizing most importantly the difference between maintenance and prns and under what circumstances the prns are to be triggered using an action plan format where appropriate.  Total time for H and P, chart review, counseling,  and generating customized AVS unique to this acute  office visit / same day charting  > 40 min with pt new to me          Christinia Gully, MD 08/03/2022

## 2022-08-03 NOTE — Assessment & Plan Note (Addendum)
Onset ? Early 2000s episodic cough flare - worse since June 2023 dx of "pna" in [place] with nl cxr 04/02/52  - cyclical cough regimen started 08/03/2022 >>>  Of the three most common causes of  Sub-acute / recurrent or chronic cough, only one (GERD)  can actually contribute to/ trigger  the other two (asthma and post nasal drip syndrome)  and perpetuate the cylce of cough.  While not intuitively obvious, many patients with chronic low grade reflux do not cough until there is a primary insult that disturbs the protective epithelial barrier and exposes sensitive nerve endings.   This is typically viral but can due to PNDS and  either may apply here.     >>> The point is that once this occurs, it is difficult to eliminate the cycle  using anything but a maximally effective acid suppression regimen at least in the short run, accompanied by an appropriate diet to address non acid GERD and control / eliminate the cough itself for at least 3 days with tessalon and eliminate pnds with 1st gen H1 blockers per guidelines  And  also so added  depomedrol 120 mg IM in case of component of Th-2 driven upper or lower airways inflammation (if cough responds short term only to relapse before return while will on full rx for uacs (as above), then  that would point to allergic rhinitis/ asthma or eos bronchitis as alternative dx)   F/u with Dr Erin Fulling as planned, return to this clinic as needed

## 2022-08-03 NOTE — Patient Instructions (Addendum)
Stop benadryl and zyrtec and albuterol   For drainage / throat tickle try take CHLORPHENIRAMINE  4 mg  ("Allergy Relief" '4mg'$   at [facility] should be easiest to find in the blue box usually on bottom shelf)  take one every 4 hours as needed - extremely effective and inexpensive over the counter- may cause drowsiness so start with just a dose or two an hour before bedtime and see how you tolerate it before trying in daytime.   Prilosec should be Take 30- 60 min before your first and last meals of the day until no cough for at least a week without the need for cough medication   For cough > tessalon 200 mg every 6 hours as needed   GERD (REFLUX)  is an extremely common cause of respiratory symptoms just like yours , many times with no obvious heartburn at all.    It can be treated with medication, but also with lifestyle changes including elevation of the head of your bed (ideally with 6 -8inch blocks under the headboard of your bed),  Smoking cessation, avoidance of late meals, excessive alcohol, and avoid fatty foods, chocolate, peppermint, colas, red wine, and acidic juices such as orange juice.  NO MINT OR MENTHOL PRODUCTS SO NO COUGH DROPS  USE [place] CANDY INSTEAD (Jolley ranchers or Stover's or Astronomer) or even ice chips will also do - the key is to swallow to prevent all throat clearing. NO OIL BASED VITAMINS - use powdered substitutes.  Avoid fish oil when coughing.   Depomedrol 120 mg   Return as needed  - bring every medication

## 2022-08-04 ENCOUNTER — Telehealth: Payer: Self-pay | Admitting: Internal Medicine

## 2022-08-04 MED ORDER — CEFDINIR 300 MG PO CAPS: 300.0000 mg | ORAL_CAPSULE | Freq: Two times a day (BID) | ORAL | 0 refills | Status: AC

## 2022-08-04 NOTE — Telephone Encounter (Signed)
Called and spoke with patient. Dr. Gustavus Bryant recommendations given.  Understanding stated. Omnicef prescription sent to requested [facility]. Nothing further at this time.

## 2022-08-04 NOTE — Telephone Encounter (Signed)
Omnicef 300 mg bid x 10 days since had assoc nasal congestion at prior ov

## 2022-08-04 NOTE — Telephone Encounter (Signed)
Called and spoke with patient.  Patient stated she was seen in office 08/03/22 by Dr. Melvyn Novas for acute visit.  Patient stated she is having increased productive cough with thick, lime green sputum.  Patient stated she has stopped albuterol, is taking Prilosec, and Tessalon as recommended. Patient is requesting a antibiotic to be sent to [facility].     Message routed to Dr. Melvyn Novas to advise  Instructions  Stop benadryl and zyrtec and albuterol    For drainage / throat tickle try take CHLORPHENIRAMINE  4 mg  ("Allergy Relief" '4mg'$   at [facility] should be easiest to find in the blue box usually on bottom shelf)  take one every 4 hours as needed - extremely effective and inexpensive over the counter- may cause drowsiness so start with just a dose or two an hour before bedtime and see how you tolerate it before trying in daytime.    Prilosec should be Take 30- 60 min before your first and last meals of the day until no cough for at least a week without the need for cough medication    For cough > tessalon 200 mg every 6 hours as needed    GERD (REFLUX)  is an extremely common cause of respiratory symptoms just like yours , many times with no obvious heartburn at all.     It can be treated with medication, but also with lifestyle changes including elevation of the head of your bed (ideally with 6 -8inch blocks under the headboard of your bed),  Smoking cessation, avoidance of late meals, excessive alcohol, and avoid fatty foods, chocolate, peppermint, colas, red wine, and acidic juices such as orange juice.  NO MINT OR MENTHOL PRODUCTS SO NO COUGH DROPS  USE [place] CANDY INSTEAD (Jolley ranchers or Stover's or Astronomer) or even ice chips will also do - the key is to swallow to prevent all throat clearing. NO OIL BASED VITAMINS - use powdered substitutes.  Avoid fish oil when coughing.    Depomedrol 120 mg    Return as needed  - bring every medication

## 2022-08-10 ENCOUNTER — Encounter (HOSPITAL_COMMUNITY): Payer: Self-pay

## 2022-08-10 NOTE — Patient Instructions (Signed)
SURGICAL WAITING ROOM VISITATION Patients having surgery or a procedure may have no more than 2 support people in the waiting area - these visitors may rotate.   Children under the age of 52 must have an adult with them who is not the patient. If the patient needs to stay at the hospital during part of their recovery, the visitor guidelines for inpatient rooms apply. Pre-op nurse will coordinate an appropriate time for 1 support person to accompany patient in pre-op.  This support person may not rotate.    Please refer to the [facility] website for the visitor guidelines for Inpatients (after your surgery is over and you are in a regular room).       Your procedure is scheduled on: Thursday, Sept 7, 2023   Report to [facility] Main Entrance    Report to admitting at 9:45 AM   Call this number if you have problems the morning of surgery [phone]   Do not eat food :After Midnight.   After Midnight you may have the following liquids until 9:00 AM DAY OF SURGERY  Water Non-Citrus Juices (without pulp, NO RED) Carbonated Beverages Black Coffee (NO MILK/CREAM OR CREAMERS, sugar ok)  Clear Tea (NO MILK/CREAM OR CREAMERS, sugar ok) regular and decaf                             Plain Jell-O (NO RED)                                           Fruit ices (not with fruit pulp, NO RED)                                     Popsicles (NO RED)                                                               Sports drinks like Gatorade (NO RED)            FOLLOW BOWEL PREP AND ANY ADDITIONAL PRE OP INSTRUCTIONS YOU RECEIVED FROM YOUR SURGEON'S OFFICE!!!     Oral Hygiene is also important to reduce your risk of infection.                                    Remember - BRUSH YOUR TEETH THE MORNING OF SURGERY WITH YOUR REGULAR TOOTHPASTE   Do NOT smoke after Midnight   Take these medicines the morning of surgery with A SIP OF WATER: Loratadine, Omeprazole, Tramadol   Bring Inhaler day  of surgery  Bring CPAP mask and tubing day of surgery.                              You may not have any metal on your body including hair pins, jewelry, and body piercing             Do not wear make-up, lotions, powders,  perfumes/cologne, or deodorant  Do not wear nail polish including gel and S&S, artificial/acrylic nails, or any other type of covering on natural nails including finger and toenails. If you have artificial nails, gel coating, etc. that needs to be removed by a nail salon please have this removed prior to surgery or surgery may need to be canceled/ delayed if the surgeon/ anesthesia feels like they are unable to be safely monitored.   Do not shave  48 hours prior to surgery.    Do not bring valuables to the hospital. [place].   Contacts, dentures or bridgework may not be worn into surgery.   Bring small overnight bag day of surgery.   DO NOT [place]. PHARMACY WILL DISPENSE MEDICATIONS LISTED ON YOUR MEDICATION LIST TO YOU DURING YOUR ADMISSION [place]!    Patients discharged on the day of surgery will not be allowed to drive home.  Someone NEEDS to stay with you for the first 24 hours after anesthesia.   Special Instructions: Bring a copy of your healthcare power of attorney and living will documents         the day of surgery if you haven't scanned them before.              Please read over the following fact sheets you were given: IF YOU HAVE QUESTIONS ABOUT YOUR PRE-OP INSTRUCTIONS PLEASE CALL [phone]     [facility] - Preparing for Surgery Before surgery, you can play an important role.  Because skin is not sterile, your skin needs to be as free of germs as possible.  You can reduce the number of germs on your skin by washing with CHG (chlorahexidine gluconate) soap before surgery.  CHG is an antiseptic cleaner which kills germs and bonds with the skin to continue  killing germs even after washing. Please DO NOT use if you have an allergy to CHG or antibacterial soaps.  If your skin becomes reddened/irritated stop using the CHG and inform your nurse when you arrive at Short Stay. Do not shave (including legs and underarms) for at least 48 hours prior to the first CHG shower.  You may shave your face/neck.  Please follow these instructions carefully:  1.  Shower with CHG Soap the night before surgery and the  morning of surgery.  2.  If you choose to wash your hair, wash your hair first as usual with your normal  shampoo.  3.  After you shampoo, rinse your hair and body thoroughly to remove the shampoo.                             4.  Use CHG as you would any other liquid soap.  You can apply chg directly to the skin and wash.  Gently with a scrungie or clean washcloth.  5.  Apply the CHG Soap to your body ONLY FROM THE NECK DOWN.   Do   not use on face/ open                           Wound or open sores. Avoid contact with eyes, ears mouth and   genitals (private parts).  Wash face,  Genitals (private parts) with your normal soap.             6.  Wash thoroughly, paying special attention to the area where your    surgery  will be performed.  7.  Thoroughly rinse your body with warm water from the neck down.  8.  DO NOT shower/wash with your normal soap after using and rinsing off the CHG Soap.                9.  Pat yourself dry with a clean towel.            10.  Wear clean pajamas.            11.  Place clean sheets on your bed the night of your first shower and do not  sleep with pets. Day of Surgery : Do not apply any lotions/deodorants the morning of surgery.  Please wear clean clothes to the hospital/surgery center.  FAILURE TO FOLLOW THESE INSTRUCTIONS MAY RESULT IN THE CANCELLATION OF YOUR SURGERY  PATIENT SIGNATURE_________________________________  NURSE  SIGNATURE__________________________________  ________________________________________________________________________

## 2022-08-10 NOTE — Progress Notes (Signed)
For Short Stay: [place] appointment date: Date of COVID positive in last 63 days:  Bowel Prep reminder:   For Anesthesia: PCP - Michael Boston, MD Cardiologist - Adrian Prows, MD Neurologist- Butler Denmark, AGNP-C, DNP last office visit note 07/26/22 in epic  Chest x-ray - 07/10/22 in epic EKG - 07/21/22 in epic Stress Test -  ECHO - 05/21/21 in epic Cardiac Cath -  Pacemaker/ICD device last checked: Pacemaker orders received: Device Rep notified:  Spinal Cord Stimulator:  Sleep Study -  CPAP -   Fasting Blood Sugar -  Checks Blood Sugar _____ times a day Date and result of last Hgb A1c-  Blood Thinner Instructions: Aspirin Instructions: Last Dose:  Activity level: Can go up a flight of stairs and activities of daily living without stopping and without chest pain and/or shortness of breath   Able to exercise without chest pain and/or shortness of breath   Unable to go up a flight of stairs without chest pain and/or shortness of breath     Anesthesia review: poor R progression noted on EKG 07/21/22, Myotonic dystrophy, OSA, HTN, COPD  Patient denies shortness of breath, fever, cough and chest pain at PAT appointment   Patient verbalized understanding of instructions that were given to them at the PAT appointment. Patient was also instructed that they will need to review over the PAT instructions again at home before surgery.

## 2022-08-10 NOTE — Progress Notes (Signed)
Sent message, via epic in basket, requesting orders in epic from surgeon.  

## 2022-08-11 ENCOUNTER — Encounter (HOSPITAL_COMMUNITY)
Admission: RE | Admit: 2022-08-11 | Discharge: 2022-08-11 | Disposition: A | Discharge status: Home / Self Care | Payer: Medicare Other | Source: Ambulatory Visit | Attending: General Surgery | Admitting: General Surgery

## 2022-08-11 ENCOUNTER — Encounter (HOSPITAL_COMMUNITY): Payer: Self-pay

## 2022-08-11 ENCOUNTER — Other Ambulatory Visit: Payer: Self-pay

## 2022-08-11 VITALS — BP 144/2 | HR 86 | Temp 97.7°F | Resp 18 | Ht 67.0 in | Wt 144.0 lb

## 2022-08-11 DIAGNOSIS — Z01812 Encounter for preprocedural laboratory examination: Secondary | ICD-10-CM | POA: Diagnosis present

## 2022-08-11 DIAGNOSIS — D696 Thrombocytopenia, unspecified: Secondary | ICD-10-CM | POA: Diagnosis not present

## 2022-08-11 DIAGNOSIS — I10 Essential (primary) hypertension: Secondary | ICD-10-CM | POA: Insufficient documentation

## 2022-08-11 HISTORY — DX: Chronic obstructive pulmonary disease, unspecified: J44.9

## 2022-08-11 LAB — CBC
HCT: 41.1 % (ref 36.0–46.0)
Hemoglobin: 12.9 g/dL (ref 12.0–15.0)
MCH: 28.4 pg (ref 26.0–34.0)
MCHC: 31.4 g/dL (ref 30.0–36.0)
MCV: 90.3 fL (ref 80.0–100.0)
Platelets: 150 10*3/uL (ref 150–400)
RBC: 4.55 MIL/uL (ref 3.87–5.11)
RDW: 15 % (ref 11.5–15.5)
WBC: 5.7 10*3/uL (ref 4.0–10.5)
nRBC: 0 % (ref 0.0–0.2)

## 2022-08-11 LAB — BASIC METABOLIC PANEL
Anion gap: 7 (ref 5–15)
BUN: 15 mg/dL (ref 8–23)
CO2: 29 mmol/L (ref 22–32)
Calcium: 9.8 mg/dL (ref 8.9–10.3)
Chloride: 105 mmol/L (ref 98–111)
Creatinine, Ser: 0.49 mg/dL (ref 0.44–1.00)
GFR, Estimated: >60 mL/min (ref >60)
Glucose, Bld: 98 mg/dL (ref 70–99)
Potassium: 5.4 mmol/L — ABNORMAL HIGH (ref 3.5–5.1)
Sodium: 141 mmol/L (ref 135–145)

## 2022-08-11 NOTE — Progress Notes (Signed)
Anesthesia note:  Bowel prep reminder:NA  PCP - Dr. Rolm Gala Cardiologist -Dr. Christen Butter Neurologist- Butler Denmark NP Pulmonologist- Dr. Charm Barges  Chest x-ray - 07/10/22-epic EKG - 07/21/22-epic Stress Test - no ECHO - 05/21/21-epic Cardiac Cath - NA  Pacemaker/ICD device last checked:NA  Sleep Study - yes CPAP - yes  Pt is pre diabetic-NA Fasting Blood Sugar -  Checks Blood Sugar _____  Blood Thinner:ASA 81 mg Blood Thinner Instructions: Aspirin Instructions:none Last Dose:08/11/22  Anesthesia review: yes  Patient denies shortness of breath, fever, cough and chest pain at PAT appointment Pt reports that the gallbladder was giving her SOB. She is now using a walker due to myotonic muscular dystrophy.   Patient verbalized understanding of instructions that were given to them at the PAT appointment. Patient was also instructed that they will need to review over the PAT instructions again at home before surgery. yes

## 2022-08-15 ENCOUNTER — Ambulatory Visit: Payer: Self-pay | Admitting: General Surgery

## 2022-08-15 NOTE — Progress Notes (Signed)
Anesthesia Chart Review   Case: [id number] Date/Time: 08/17/22 1145   Procedure: LAPAROSCOPIC CHOLECYSTECTOMY   Anesthesia type: General   Pre-op diagnosis: GALLSTONES   Location: [facility]OM 05 / [facility]   Surgeons: Kinsinger, Arta Bruce, MD       DISCUSSION:66 y.o. former smoker with h/o HTN, GERD, COPD, Myotonic muscular dystrophy type II, gallstones scheduled for above procedure 08/17/2022 with Dr. Gurney Maxin.   Pt followed by neurology for muscular dystrophy.  Per note moderate lower extremity weakness.   Pt evaluated by cardiology 07/21/2022. Per OV note, "stable from cardiac standpoint, her echocardiogram reveals normal LV systolic function which is unchanged from 2018, hence although she has myotonic dystrophy, there is no involvement of the myocardium. "  Anticipate pt can proceed with planned procedure barring acute status change and after evaluation DOS.  VS: BP (!) 144/2   Pulse 86   Temp 36.5 C (Oral)   Resp 18   Ht '5\' 7"'$  (1.702 m)   Wt 65.3 kg   SpO2 100%   BMI 22.55 kg/m   PROVIDERS: Wile, Jesse Sans, MD  Christinia Gully, MD is Pulmonologist   Adrian Prows, MD is Cardiologist  LABS: Labs reviewed: Acceptable for surgery. (all labs ordered are listed, but only abnormal results are displayed)  Labs Reviewed  BASIC METABOLIC PANEL - Abnormal; Notable for the following components:      Result Value   Potassium 5.4 (*)    All other components within normal limits  CBC     IMAGES: Chest Xray 07/10/2022 IMPRESSION: No active cardiopulmonary disease.  EKG:   CV: Echocardiogram 05/19/2021:  Normal LV systolic function with visual EF 60-65%. Left ventricle cavity  is normal in size. Normal global wall motion. Normal diastolic filling  pattern, normal LAP.  Mild (Grade I) mitral regurgitation.  Mild tricuspid regurgitation. No evidence of pulmonary hypertension.  Compared to study dated 02/01/2017 no significant change.  Past Medical History:  Diagnosis Date    Chronic low back pain    COPD (chronic obstructive pulmonary disease) (HCC)    Elevated LFTs    GERD (gastroesophageal reflux disease)    Goiter    Hearing loss    Hypertension    Myotonic dystrophy, type 2 ([place]) 06/16/2020   Pituitary tumor    Recurrent upper respiratory infection (URI)    Tinnitus    Vitamin B deficiency     Past Surgical History:  Procedure Laterality Date   BACK SURGERY     DILATION AND CURETTAGE OF UTERUS  1990   x3   WISDOM TOOTH EXTRACTION     age 29    MEDICATIONS:  albuterol (VENTOLIN HFA) 108 (90 Base) MCG/ACT inhaler   ALPRAZolam (XANAX) 0.5 MG tablet   aspirin EC 81 MG tablet   benzonatate (TESSALON) 200 MG capsule   betamethasone dipropionate 0.05 % cream   calcium-vitamin D 250-100 MG-UNIT per tablet   cefdinir (OMNICEF) 300 MG capsule   cyanocobalamin 1000 MCG tablet   loratadine (CLARITIN) 10 MG tablet   losartan-hydrochlorothiazide (HYZAAR) 100-12.5 MG tablet   Multiple Vitamin (MULTI-VITAMIN) tablet   NON FORMULARY   omeprazole (PRILOSEC) 20 MG capsule   sodium chloride (OCEAN) 0.65 % SOLN nasal spray   traMADol (ULTRAM) 50 MG tablet   zolpidem (AMBIEN) 5 MG tablet   No current facility-administered medications for this encounter.    Konrad Felix Ward, PA-C [facility] [phone]

## 2022-08-17 ENCOUNTER — Other Ambulatory Visit: Payer: Self-pay

## 2022-08-17 ENCOUNTER — Observation Stay (HOSPITAL_COMMUNITY)
Admission: RE | Admit: 2022-08-17 | Discharge: 2022-08-17 | Disposition: A | Discharge status: Home / Self Care | Payer: Medicare Other | Attending: General Surgery | Admitting: General Surgery

## 2022-08-17 ENCOUNTER — Ambulatory Visit (HOSPITAL_BASED_OUTPATIENT_CLINIC_OR_DEPARTMENT_OTHER): Payer: Medicare Other | Admitting: Anesthesiology

## 2022-08-17 ENCOUNTER — Encounter (HOSPITAL_COMMUNITY): Payer: Self-pay | Admitting: General Surgery

## 2022-08-17 ENCOUNTER — Ambulatory Visit (HOSPITAL_COMMUNITY): Payer: Medicare Other | Admitting: Physician Assistant

## 2022-08-17 ENCOUNTER — Encounter (HOSPITAL_COMMUNITY)
Admission: RE | Disposition: A | Discharge status: Home / Self Care | Payer: Self-pay | Source: Home / Self Care | Attending: General Surgery

## 2022-08-17 DIAGNOSIS — Z79899 Other long term (current) drug therapy: Secondary | ICD-10-CM | POA: Insufficient documentation

## 2022-08-17 DIAGNOSIS — Z87891 Personal history of nicotine dependence: Secondary | ICD-10-CM | POA: Insufficient documentation

## 2022-08-17 DIAGNOSIS — K8010 Calculus of gallbladder with chronic cholecystitis without obstruction: Secondary | ICD-10-CM | POA: Diagnosis present

## 2022-08-17 DIAGNOSIS — I10 Essential (primary) hypertension: Secondary | ICD-10-CM | POA: Insufficient documentation

## 2022-08-17 DIAGNOSIS — E039 Hypothyroidism, unspecified: Secondary | ICD-10-CM | POA: Diagnosis not present

## 2022-08-17 DIAGNOSIS — J449 Chronic obstructive pulmonary disease, unspecified: Secondary | ICD-10-CM | POA: Diagnosis not present

## 2022-08-17 DIAGNOSIS — K8020 Calculus of gallbladder without cholecystitis without obstruction: Secondary | ICD-10-CM | POA: Diagnosis not present

## 2022-08-17 DIAGNOSIS — D696 Thrombocytopenia, unspecified: Secondary | ICD-10-CM

## 2022-08-17 HISTORY — PX: CHOLECYSTECTOMY: SHX55

## 2022-08-17 LAB — CREATININE, SERUM
Creatinine, Ser: 0.44 mg/dL (ref 0.44–1.00)
GFR, Estimated: >60 mL/min (ref >60)

## 2022-08-17 LAB — CBC
HCT: 43 % (ref 36.0–46.0)
Hemoglobin: 13.3 g/dL (ref 12.0–15.0)
MCH: 28.3 pg (ref 26.0–34.0)
MCHC: 30.9 g/dL (ref 30.0–36.0)
MCV: 91.5 fL (ref 80.0–100.0)
Platelets: 106 10*3/uL — ABNORMAL LOW (ref 150–400)
RBC: 4.7 MIL/uL (ref 3.87–5.11)
RDW: 15.1 % (ref 11.5–15.5)
WBC: 5.2 10*3/uL (ref 4.0–10.5)
nRBC: 0 % (ref 0.0–0.2)

## 2022-08-17 SURGERY — LAPAROSCOPIC CHOLECYSTECTOMY
Anesthesia: General

## 2022-08-17 MED ORDER — ACETAMINOPHEN 500 MG PO TABS
1000.0000 mg | ORAL_TABLET | ORAL | Status: AC
  Administered 2022-08-17: 1000 mg via ORAL
  Filled 2022-08-17: qty 2

## 2022-08-17 MED ORDER — SUGAMMADEX SODIUM 500 MG/5ML IV SOLN
INTRAVENOUS | Status: AC
  Filled 2022-08-17: qty 10

## 2022-08-17 MED ORDER — KETOROLAC TROMETHAMINE 30 MG/ML IJ SOLN
INTRAMUSCULAR | Status: AC
  Filled 2022-08-17: qty 1

## 2022-08-17 MED ORDER — FENTANYL CITRATE (PF) 250 MCG/5ML IJ SOLN
INTRAMUSCULAR | Status: AC
  Filled 2022-08-17: qty 5

## 2022-08-17 MED ORDER — 0.9 % SODIUM CHLORIDE (POUR BTL) OPTIME
TOPICAL | Status: DC | PRN
  Administered 2022-08-17: 1000 mL

## 2022-08-17 MED ORDER — CHLORHEXIDINE GLUCONATE CLOTH 2 % EX PADS: 6.0000 | MEDICATED_PAD | Freq: Once | CUTANEOUS | Status: DC

## 2022-08-17 MED ORDER — AMISULPRIDE (ANTIEMETIC) 5 MG/2ML IV SOLN
10.0000 mg | Freq: Once | INTRAVENOUS | Status: DC | PRN
Start: 2022-08-17 — End: 2022-08-17

## 2022-08-17 MED ORDER — LIDOCAINE HCL (PF) 2 % IJ SOLN
INTRAMUSCULAR | Status: AC
  Filled 2022-08-17: qty 20

## 2022-08-17 MED ORDER — FENTANYL CITRATE PF 50 MCG/ML IJ SOSY
25.0000 ug | PREFILLED_SYRINGE | INTRAMUSCULAR | Status: DC | PRN
  Administered 2022-08-17 (×3): 50 ug via INTRAVENOUS

## 2022-08-17 MED ORDER — MIDAZOLAM HCL 2 MG/2ML IJ SOLN
INTRAMUSCULAR | Status: AC
Start: 2022-08-17 — End: ?
  Filled 2022-08-17: qty 2

## 2022-08-17 MED ORDER — DEXAMETHASONE SODIUM PHOSPHATE 10 MG/ML IJ SOLN
INTRAMUSCULAR | Status: DC | PRN
  Administered 2022-08-17: 10 mg via INTRAVENOUS

## 2022-08-17 MED ORDER — HYDROCHLOROTHIAZIDE 12.5 MG PO TABS: 12.5000 mg | ORAL_TABLET | Freq: Every day | ORAL | Status: DC

## 2022-08-17 MED ORDER — ZOLPIDEM TARTRATE 5 MG PO TABS: 5.0000 mg | ORAL_TABLET | Freq: Every day | ORAL | Status: DC

## 2022-08-17 MED ORDER — TRAMADOL HCL 50 MG PO TABS: 50.0000 mg | ORAL_TABLET | Freq: Four times a day (QID) | ORAL | Status: DC | PRN

## 2022-08-17 MED ORDER — SUCCINYLCHOLINE CHLORIDE 200 MG/10ML IV SOSY
PREFILLED_SYRINGE | INTRAVENOUS | Status: AC
  Filled 2022-08-17: qty 20

## 2022-08-17 MED ORDER — PROPOFOL 10 MG/ML IV BOLUS
INTRAVENOUS | Status: DC | PRN
  Administered 2022-08-17: 120 mg via INTRAVENOUS

## 2022-08-17 MED ORDER — PANTOPRAZOLE SODIUM 40 MG PO TBEC
40.0000 mg | DELAYED_RELEASE_TABLET | Freq: Every day | ORAL | Status: DC
  Administered 2022-08-17: 40 mg via ORAL
  Filled 2022-08-17: qty 1

## 2022-08-17 MED ORDER — LACTATED RINGERS IV SOLN: INTRAVENOUS | Status: DC

## 2022-08-17 MED ORDER — FENTANYL CITRATE PF 50 MCG/ML IJ SOSY
PREFILLED_SYRINGE | INTRAMUSCULAR | Status: AC
  Filled 2022-08-17: qty 2

## 2022-08-17 MED ORDER — OXYCODONE HCL 5 MG PO TABS: 5.0000 mg | ORAL_TABLET | Freq: Four times a day (QID) | ORAL | 0 refills | Status: AC | PRN

## 2022-08-17 MED ORDER — ALBUTEROL SULFATE (2.5 MG/3ML) 0.083% IN NEBU: 2.5000 mL | INHALATION_SOLUTION | RESPIRATORY_TRACT | Status: DC | PRN

## 2022-08-17 MED ORDER — ORAL CARE MOUTH RINSE: 15.0000 mL | Freq: Once | OROMUCOSAL | Status: AC

## 2022-08-17 MED ORDER — ENOXAPARIN SODIUM 40 MG/0.4ML IJ SOSY: 40.0000 mg | PREFILLED_SYRINGE | INTRAMUSCULAR | Status: DC

## 2022-08-17 MED ORDER — EPHEDRINE 5 MG/ML INJ
INTRAVENOUS | Status: AC
Start: 2022-08-17 — End: ?
  Filled 2022-08-17: qty 5

## 2022-08-17 MED ORDER — ACETAMINOPHEN 500 MG PO TABS: 1000.0000 mg | ORAL_TABLET | Freq: Once | ORAL | Status: DC

## 2022-08-17 MED ORDER — MIDAZOLAM HCL 2 MG/2ML IJ SOLN
INTRAMUSCULAR | Status: DC | PRN
  Administered 2022-08-17 (×3): .5 mg via INTRAVENOUS

## 2022-08-17 MED ORDER — DEXAMETHASONE SODIUM PHOSPHATE 10 MG/ML IJ SOLN
INTRAMUSCULAR | Status: AC
  Filled 2022-08-17: qty 3

## 2022-08-17 MED ORDER — HYDRALAZINE HCL 20 MG/ML IJ SOLN: 10.0000 mg | INTRAMUSCULAR | Status: DC | PRN

## 2022-08-17 MED ORDER — CHLORHEXIDINE GLUCONATE 0.12 % MT SOLN
15.0000 mL | Freq: Once | OROMUCOSAL | Status: AC
  Administered 2022-08-17: 15 mL via OROMUCOSAL

## 2022-08-17 MED ORDER — CEFAZOLIN SODIUM-DEXTROSE 2-4 GM/100ML-% IV SOLN
2.0000 g | INTRAVENOUS | Status: AC
  Administered 2022-08-17: 2 g via INTRAVENOUS
  Filled 2022-08-17: qty 100

## 2022-08-17 MED ORDER — ROCURONIUM BROMIDE 10 MG/ML (PF) SYRINGE
PREFILLED_SYRINGE | INTRAVENOUS | Status: AC
  Filled 2022-08-17: qty 30

## 2022-08-17 MED ORDER — ASPIRIN 81 MG PO TBEC: 81.0000 mg | DELAYED_RELEASE_TABLET | Freq: Every day | ORAL | Status: DC

## 2022-08-17 MED ORDER — FENTANYL CITRATE PF 50 MCG/ML IJ SOSY
PREFILLED_SYRINGE | INTRAMUSCULAR | Status: AC
  Filled 2022-08-17: qty 1

## 2022-08-17 MED ORDER — FENTANYL CITRATE (PF) 250 MCG/5ML IJ SOLN
INTRAMUSCULAR | Status: DC | PRN
Start: 2022-08-17 — End: 2022-08-17
  Administered 2022-08-17: 25 ug via INTRAVENOUS
  Administered 2022-08-17 (×4): 50 ug via INTRAVENOUS
  Administered 2022-08-17: 25 ug via INTRAVENOUS

## 2022-08-17 MED ORDER — LIDOCAINE 2% (20 MG/ML) 5 ML SYRINGE
INTRAMUSCULAR | Status: DC | PRN
  Administered 2022-08-17: 60 mg via INTRAVENOUS

## 2022-08-17 MED ORDER — PHENYLEPHRINE 80 MCG/ML (10ML) SYRINGE FOR IV PUSH (FOR BLOOD PRESSURE SUPPORT)
PREFILLED_SYRINGE | INTRAVENOUS | Status: AC
  Filled 2022-08-17: qty 20

## 2022-08-17 MED ORDER — ONDANSETRON HCL 4 MG/2ML IJ SOLN
INTRAMUSCULAR | Status: AC
  Filled 2022-08-17: qty 6

## 2022-08-17 MED ORDER — MORPHINE SULFATE (PF) 2 MG/ML IV SOLN: 2.0000 mg | INTRAVENOUS | Status: DC | PRN

## 2022-08-17 MED ORDER — ROCURONIUM BROMIDE 10 MG/ML (PF) SYRINGE
PREFILLED_SYRINGE | INTRAVENOUS | Status: DC | PRN
  Administered 2022-08-17: 50 mg via INTRAVENOUS

## 2022-08-17 MED ORDER — SUGAMMADEX SODIUM 200 MG/2ML IV SOLN
INTRAVENOUS | Status: DC | PRN
  Administered 2022-08-17: 200 mg via INTRAVENOUS

## 2022-08-17 MED ORDER — KETOROLAC TROMETHAMINE 30 MG/ML IJ SOLN
30.0000 mg | Freq: Once | INTRAMUSCULAR | Status: AC
  Administered 2022-08-17: 30 mg via INTRAVENOUS

## 2022-08-17 MED ORDER — BUPIVACAINE-EPINEPHRINE 0.25% -1:200000 IJ SOLN
INTRAMUSCULAR | Status: DC | PRN
  Administered 2022-08-17: 30 mL

## 2022-08-17 MED ORDER — LOSARTAN POTASSIUM 50 MG PO TABS: 100.0000 mg | ORAL_TABLET | Freq: Every day | ORAL | Status: DC

## 2022-08-17 MED ORDER — BUPIVACAINE-EPINEPHRINE (PF) 0.25% -1:200000 IJ SOLN
INTRAMUSCULAR | Status: AC
  Filled 2022-08-17: qty 30

## 2022-08-17 MED ORDER — METOPROLOL TARTRATE 5 MG/5ML IV SOLN: 5.0000 mg | Freq: Four times a day (QID) | INTRAVENOUS | Status: DC | PRN

## 2022-08-17 MED ORDER — ONDANSETRON HCL 4 MG/2ML IJ SOLN
INTRAMUSCULAR | Status: DC | PRN
  Administered 2022-08-17: 4 mg via INTRAVENOUS

## 2022-08-17 MED ORDER — OXYCODONE HCL 5 MG PO TABS
5.0000 mg | ORAL_TABLET | ORAL | Status: DC | PRN
  Administered 2022-08-17: 10 mg via ORAL
  Filled 2022-08-17: qty 2

## 2022-08-17 MED ORDER — PROPOFOL 10 MG/ML IV BOLUS
INTRAVENOUS | Status: AC
  Filled 2022-08-17: qty 20

## 2022-08-17 MED ORDER — ONDANSETRON 4 MG PO TBDP: 4.0000 mg | ORAL_TABLET | Freq: Four times a day (QID) | ORAL | Status: DC | PRN

## 2022-08-17 MED ORDER — LACTATED RINGERS IR SOLN
Status: DC | PRN
  Administered 2022-08-17: 1000 mL

## 2022-08-17 MED ORDER — BENZONATATE 100 MG PO CAPS
200.0000 mg | ORAL_CAPSULE | Freq: Three times a day (TID) | ORAL | Status: DC | PRN
Start: 2022-08-17 — End: 2022-08-17

## 2022-08-17 MED ORDER — ONDANSETRON HCL 4 MG/2ML IJ SOLN: 4.0000 mg | Freq: Four times a day (QID) | INTRAMUSCULAR | Status: DC | PRN

## 2022-08-17 MED ORDER — LOSARTAN POTASSIUM-HCTZ 100-12.5 MG PO TABS: 1.0000 | ORAL_TABLET | Freq: Every day | ORAL | Status: DC

## 2022-08-17 MED ORDER — ALPRAZOLAM 0.25 MG PO TABS: 0.2500 mg | ORAL_TABLET | Freq: Every day | ORAL | Status: DC | PRN

## 2022-08-17 SURGICAL SUPPLY — 52 items
APL PRP STRL LF DISP 70% ISPRP (MISCELLANEOUS) ×1
APL SKNCLS STERI-STRIP NONHPOA (GAUZE/BANDAGES/DRESSINGS) ×1
APPLIER CLIP 5 13 M/L LIGAMAX5 (MISCELLANEOUS)
APPLIER CLIP ROT 10 11.4 M/L (STAPLE)
APR CLP MED LRG 11.4X10 (STAPLE)
APR CLP MED LRG 5 ANG JAW (MISCELLANEOUS)
BAG COUNTER SPONGE SURGICOUNT (BAG) IMPLANT
BAG SPEC RTRVL 10 TROC 200 (ENDOMECHANICALS) ×1
BAG SPNG CNTER NS LX DISP (BAG)
BENZOIN TINCTURE PRP APPL 2/3 (GAUZE/BANDAGES/DRESSINGS) ×1 IMPLANT
BNDG ADH 1X3 SHEER STRL LF (GAUZE/BANDAGES/DRESSINGS) ×4 IMPLANT
BNDG ADH THN 3X1 STRL LF (GAUZE/BANDAGES/DRESSINGS) ×4
CABLE HIGH FREQUENCY MONO STRZ (ELECTRODE) ×1 IMPLANT
CHLORAPREP W/TINT 26 (MISCELLANEOUS) ×1 IMPLANT
CLIP APPLIE 5 13 M/L LIGAMAX5 (MISCELLANEOUS) IMPLANT
CLIP APPLIE ROT 10 11.4 M/L (STAPLE) IMPLANT
CLIP LIGATING HEM O LOK PURPLE (MISCELLANEOUS) IMPLANT
CLIP LIGATING HEMO O LOK GREEN (MISCELLANEOUS) IMPLANT
COVER MAYO STAND XLG (MISCELLANEOUS) ×1 IMPLANT
COVER SURGICAL LIGHT HANDLE (MISCELLANEOUS) ×1 IMPLANT
DRAIN CHANNEL 19F RND (DRAIN) IMPLANT
DRAPE C-ARM 42X120 X-RAY (DRAPES) IMPLANT
EVACUATOR SILICONE 100CC (DRAIN) IMPLANT
GLOVE BIOGEL PI IND STRL 7.0 (GLOVE) ×1 IMPLANT
GLOVE SURG SS PI 7.0 STRL IVOR (GLOVE) ×1 IMPLANT
GOWN STRL REUS W/ TWL LRG LVL3 (GOWN DISPOSABLE) ×1 IMPLANT
GOWN STRL REUS W/ TWL XL LVL3 (GOWN DISPOSABLE) IMPLANT
GOWN STRL REUS W/TWL LRG LVL3 (GOWN DISPOSABLE) ×1
GOWN STRL REUS W/TWL XL LVL3 (GOWN DISPOSABLE)
GRASPER SUT TROCAR 14GX15 (MISCELLANEOUS) IMPLANT
IRRIG SUCT STRYKERFLOW 2 WTIP (MISCELLANEOUS) ×1
IRRIGATION SUCT STRKRFLW 2 WTP (MISCELLANEOUS) ×1 IMPLANT
KIT BASIN OR (CUSTOM PROCEDURE TRAY) ×1 IMPLANT
KIT TURNOVER KIT A (KITS) IMPLANT
POUCH RETRIEVAL ECOSAC 10 (ENDOMECHANICALS) ×1 IMPLANT
POUCH RETRIEVAL ECOSAC 10MM (ENDOMECHANICALS) ×1
SCISSORS LAP 5X35 DISP (ENDOMECHANICALS) ×1 IMPLANT
SET CHOLANGIOGRAPH MIX (MISCELLANEOUS) IMPLANT
SET TUBE SMOKE EVAC HIGH FLOW (TUBING) ×1 IMPLANT
SLEEVE Z-THREAD 5X100MM (TROCAR) ×2 IMPLANT
SPIKE FLUID TRANSFER (MISCELLANEOUS) ×1 IMPLANT
STOPCOCK 4 WAY LG BORE MALE ST (IV SETS) IMPLANT
STRIP CLOSURE SKIN 1/2X4 (GAUZE/BANDAGES/DRESSINGS) ×1 IMPLANT
SUT ETHILON 2 0 PS N (SUTURE) IMPLANT
SUT MNCRL AB 4-0 PS2 18 (SUTURE) ×1 IMPLANT
SUT VICRYL 0 ENDOLOOP (SUTURE) IMPLANT
SUT VICRYL 0 UR6 27IN ABS (SUTURE) IMPLANT
TOWEL OR 17X26 10 PK STRL BLUE (TOWEL DISPOSABLE) ×1 IMPLANT
TOWEL OR NON WOVEN STRL DISP B (DISPOSABLE) IMPLANT
TRAY LAPAROSCOPIC (CUSTOM PROCEDURE TRAY) ×1 IMPLANT
TROCAR 11X100 Z THREAD (TROCAR) ×1 IMPLANT
TROCAR Z-THREAD OPTICAL 5X100M (TROCAR) ×1 IMPLANT

## 2022-08-17 NOTE — Anesthesia Preprocedure Evaluation (Addendum)
Anesthesia Evaluation  Patient identified by MRN, date of birth, ID band Patient awake    Reviewed: Allergy & Precautions, NPO status , Patient's Chart, lab work & pertinent test results  History of Anesthesia Complications Negative for: history of anesthetic complications  Airway Mallampati: II  TM Distance: >3 FB Neck ROM: Full    Dental no notable dental hx.    Pulmonary COPD,  COPD inhaler, former smoker,    Pulmonary exam normal        Cardiovascular hypertension, Pt. on medications Normal cardiovascular exam     Neuro/Psych Depression Myotonic dystrophy, type 2     GI/Hepatic Neg liver ROS, GERD  Controlled and Medicated,  Endo/Other  Hypothyroidism   Renal/GU negative Renal ROS  negative genitourinary   Musculoskeletal negative musculoskeletal ROS (+)   Abdominal   Peds  Hematology negative hematology ROS (+)   Anesthesia Other Findings Day of surgery medications reviewed with patient.  Reproductive/Obstetrics negative OB ROS                            Anesthesia Physical Anesthesia Plan  ASA: 3  Anesthesia Plan: General   Post-op Pain Management: Tylenol PO (pre-op)*   Induction: Intravenous  PONV Risk Score and Plan: 4 or greater and Treatment may vary due to age or medical condition, Midazolam, Ondansetron and Dexamethasone  Airway Management Planned: Oral ETT  Additional Equipment: None  Intra-op Plan:   Post-operative Plan: Extubation in OR  Informed Consent: I have reviewed the patients History and Physical, chart, labs and discussed the procedure including the risks, benefits and alternatives for the proposed anesthesia with the patient or authorized representative who has indicated his/her understanding and acceptance.     Dental advisory given  Plan Discussed with: CRNA  Anesthesia Plan Comments:        Anesthesia Quick Evaluation

## 2022-08-17 NOTE — Anesthesia Procedure Notes (Signed)
Date/Time: 08/17/2022 12:22 PM  Performed by: Cynda Familia, CRNAOxygen Delivery Method: Simple face mask Placement Confirmation: positive ETCO2 and breath sounds checked- equal and bilateral Dental Injury: Teeth and Oropharynx as per pre-operative assessment

## 2022-08-17 NOTE — Discharge Instructions (Signed)
[facility] ______[facility] LAPAROSCOPIC SURGERY: POST OP INSTRUCTIONS Always review your discharge instruction sheet given to you by the facility where your surgery was performed. IF YOU HAVE DISABILITY OR FAMILY LEAVE FORMS, YOU MUST BRING THEM TO THE OFFICE FOR PROCESSING.   DO NOT GIVE THEM TO YOUR DOCTOR.  A prescription for pain medication may be given to you upon discharge.  Take your pain medication as prescribed, if needed.  If narcotic pain medicine is not needed, then you may take acetaminophen (Tylenol) or ibuprofen (Advil) as needed. Take your usually prescribed medications unless otherwise directed. If you need a refill on your pain medication, please contact your pharmacy.  They will contact our office to request authorization. Prescriptions will not be filled after 5pm or on week-ends. You should follow a light diet the first few days after arrival home, such as soup and crackers, etc.  Be sure to include lots of fluids daily. Most patients will experience some swelling and bruising in the area of the incisions.  Ice packs will help.  Swelling and bruising can take several days to resolve.  It is common to experience some constipation if taking pain medication after surgery.  Increasing fluid intake and taking a stool softener (such as Colace) will usually help or prevent this problem from occurring.  A mild laxative (Milk of Magnesia or Miralax) should be taken according to package instructions if there are no bowel movements after 48 hours. Unless discharge instructions indicate otherwise, you may remove your bandages 24-48 hours after surgery, and you may shower at that time.  You may have steri-strips (small skin tapes) in place directly over the incision.  These strips should be left on the skin for 7-10 days.  If your surgeon used skin glue on the incision, you may shower in 24 hours.  The glue will flake off over the next 2-3 weeks.  Any sutures or staples will be  removed at the office during your follow-up visit. ACTIVITIES:  You may resume regular (light) daily activities beginning the next day--such as daily self-care, walking, climbing stairs--gradually increasing activities as tolerated.  You may have sexual intercourse when it is comfortable.  Refrain from any heavy lifting or straining until approved by your doctor. You may drive when you are no longer taking prescription pain medication, you can comfortably wear a seatbelt, and you can safely maneuver your car and apply brakes. RETURN TO WORK:  __________________________________________________________ You should see your doctor in the office for a follow-up appointment approximately 2-3 weeks after your surgery.  Make sure that you call for this appointment within a day or two after you arrive home to insure a convenient appointment time. OTHER INSTRUCTIONS: __________________________________________________________________________________________________________________________ __________________________________________________________________________________________________________________________ WHEN TO CALL YOUR DOCTOR: Fever over 101.0 Inability to urinate Continued bleeding from incision. Increased pain, redness, or drainage from the incision. Increasing abdominal pain  The clinic staff is available to answer your questions during regular business hours.  Please don't hesitate to call and ask to speak to one of the nurses for clinical concerns.  If you have a medical emergency, go to the nearest emergency room or call 911.  A surgeon from [facility] is always on call at the hospital. [address], [address], [place], [place]  [address] ? [address] [address], , [place]   [address] [phone] ? [phone] ? FAX [phone] Web site: [email or web address]  

## 2022-08-17 NOTE — Anesthesia Postprocedure Evaluation (Signed)
Anesthesia Post Note  Patient: [name] [name]  Procedure(s) Performed: LAPAROSCOPIC CHOLECYSTECTOMY     Patient location during evaluation: PACU Anesthesia Type: General Level of consciousness: awake and alert Pain management: pain level controlled Vital Signs Assessment: post-procedure vital signs reviewed and stable Respiratory status: spontaneous breathing, nonlabored ventilation and respiratory function stable Cardiovascular status: blood pressure returned to baseline Postop Assessment: no apparent nausea or vomiting Anesthetic complications: no   No notable events documented.  Last Vitals:  Vitals:   08/17/22 1523 08/17/22 1625  BP: 106/87 136/70  Pulse: 80 72  Resp: 16 16  Temp: 36.8 C 36.8 C  SpO2: 100% 99%                  [name]

## 2022-08-17 NOTE — Transfer of Care (Signed)
Immediate Anesthesia Transfer of Care Note  Patient: [name] [name]  Procedure(s) Performed: LAPAROSCOPIC CHOLECYSTECTOMY  Patient Location: PACU  Anesthesia Type:General  Level of Consciousness: awake and alert   Airway & Oxygen Therapy: Patient Spontanous Breathing and Patient connected to face mask oxygen  Post-op Assessment: Report given to RN and Post -op Vital signs reviewed and stable  Post vital signs: Reviewed and stable  Last Vitals:  Vitals Value Taken Time  BP 151/90 08/17/22 1230  Temp    Pulse 89 08/17/22 1230  Resp 12 08/17/22 1230  SpO2 100 % 08/17/22 1230  Vitals shown include unvalidated device data.  Last Pain:  Vitals:   08/17/22 1010  TempSrc:   PainSc: 5       Patients Stated Pain Goal: 4 (82/57/49 3552)  Complications: No notable events documented.

## 2022-08-17 NOTE — Anesthesia Procedure Notes (Signed)
Procedure Name: Intubation Date/Time: 08/17/2022 11:27 AM  Performed by: Cynda Familia, CRNAPre-anesthesia Checklist: Patient identified, Emergency Drugs available, Suction available and Patient being monitored Patient Re-evaluated:Patient Re-evaluated prior to induction Oxygen Delivery Method: Circle System Utilized Preoxygenation: Pre-oxygenation with 100% oxygen Induction Type: IV induction and Cricoid Pressure applied Ventilation: Mask ventilation without difficulty Laryngoscope Size: Miller and 2 Grade View: Grade I Tube type: Oral Number of attempts: 1 Airway Equipment and Method: Stylet Placement Confirmation: ETT inserted through vocal cords under direct vision, positive ETCO2 and breath sounds checked- equal and bilateral Secured at: 22 cm Tube secured with: Tape Dental Injury: Teeth and Oropharynx as per pre-operative assessment  Comments: IV induction Howze-- intubation AM CRNA atraumatic-- teeth and mouth as preop- bilat BS

## 2022-08-17 NOTE — Progress Notes (Signed)
Patient was given discharge instructions, and all questions were answered.  Patient was stable for discharge and was taken to the main exit by wheelchair. 

## 2022-08-17 NOTE — Op Note (Signed)
PATIENT:  [name] [name]  66 y.o. female  PRE-OPERATIVE DIAGNOSIS:  GALLSTONES  POST-OPERATIVE DIAGNOSIS:  GALLSTONES  PROCEDURE:  Procedure(s): LAPAROSCOPIC CHOLECYSTECTOMY   SURGEON:  Marena Witts, Arta Bruce, MD   ASSISTANT: none  ANESTHESIA:   local and general  Indications for procedure: [name] is a 66 y.o. female with symptoms of Abdominal pain consistent with gallbladder disease, Confirmed by ultrasound.  Description of procedure: The patient was brought into the operative suite, placed supine. Anesthesia was administered with endotracheal tube. Patient was strapped in place and foot board was secured. All pressure points were offloaded by foam padding. The patient was prepped and draped in the usual sterile fashion.  A periumbilical incision was made and optical entry was used to enter the abdomen. 2 5 mm trocars were placed on in the right lateral space on in the right subcostal space. A 51m trocar was placed in the subxiphoid space. Marcaine was infused to the subxiphoid space and lateral upper right abdomen in the transversus abdominis plane. Next the patient was placed in reverse trendelenberg. The gallbladder appearedfull of stones and chronically inflamed.   The gallbladder was retracted cephalad and lateral. The peritoneum was reflected off the infundibulum working lateral to medial. The cystic duct and cystic artery were identified and further dissection revealed a critical view.  The cystic duct and cystic artery were doubly clipped and ligated.   The gallbladder was removed off the liver bed with cautery. The Gallbladder was placed in a specimen bag. The gallbladder fossa was irrigated and hemostasis was applied with cautery. The gallbladder was removed via the 164mtrocar. The fascial defect was closed with interrupted 0 vicryl suture via laparoscopic trans-fascial suture passer. Pneumoperitoneum was removed, all trocar were removed. All incisions were closed with  4-0 monocryl subcuticular stitch. The patient woke from anesthesia and was brought to PACU in stable condition. All counts were correct  Findings: chronic calculous cholecystectomy  Specimen: gallbladder  Blood loss: 20 ml  Local anesthesia: 30 ml Marcaine  Complications: none  PLAN OF CARE: Admit for overnight observation  PATIENT DISPOSITION:  PACU - hemodynamically stable.  [facility], PA[place]

## 2022-08-17 NOTE — H&P (Signed)
History of Present Illness: [name] [name] is a 66 y.o. female who is seen today as an office consultation at the request of Dr. Jacalyn Lefevre for evaluation of New Patient (Cholecystectomy ) .   She has had pain and bloating for the past few months. Happens every day. It is not associated with food. She has some nausea.  He has a history of myotonic dystrophy that leads to some generalized weakness. She is able to move around well with a rolling walker. She can lift up to 15 pounds at a time but it does provide difficulty.  Review of Systems: A complete review of systems was obtained from the patient. I have reviewed this information and discussed as appropriate with the patient. See HPI as well for other ROS.  Review of Systems  Constitutional: Negative.  HENT: Negative.  Eyes: Negative.  Respiratory: Negative.  Cardiovascular: Negative.  Gastrointestinal: Positive for abdominal pain and nausea.  Genitourinary: Negative.  Musculoskeletal: Negative.  Skin: Negative.  Neurological: Positive for weakness.  Endo/Heme/Allergies: Negative.  Psychiatric/Behavioral: Negative.   Medical History: Past Medical History:  Diagnosis Date  Anemia  Anxiety  Arthritis  COPD (chronic obstructive pulmonary disease) (CMS-HCC)  GERD (gastroesophageal reflux disease)  Hypertension  Sleep apnea   There is no problem list on file for this patient.  Past Surgical History:  Procedure Laterality Date  buldg disc 2007    Allergies  Allergen Reactions  Doxycycline Shortness Of Breath   Current Outpatient Medications on File Prior to Visit  Medication Sig Dispense Refill  azithromycin (ZITHROMAX) 250 MG tablet TAKE 2 TABLETS BY MOUTH ON DAY 1, AND THEN TAKE 1 TABLET BY MOUTH ONCE A DAY ON DAY 2 THROUGH DAY 5  traMADoL (ULTRAM) 50 mg tablet TAKE 2 TABLETS BY MOUTH TWICE DAILY AS NEEDED FOR PAIN FOR 30 DAYS  betamethasone dipropionate (DIPROSONE) 0.05 % cream APPLY A THIN LAYER TO THE AFFECTED AREA(S) BY  TOPICAL ROUTE ONCE DAILY  calcium citrate malate-vit D3 (OSCAL) 250 mg-2.5 mcg (100 unit) Tab tablet Take 1 tablet by mouth 2 (two) times daily  cyanocobalamin (VITAMIN B12) 1000 MCG tablet Take by mouth  zolpidem (AMBIEN) 10 mg tablet Take by mouth   No current facility-administered medications on file prior to visit.   Family History  Problem Relation Age of Onset  Obesity Mother  High blood pressure (Hypertension) Mother  Coronary Artery Disease (Blocked arteries around heart) Mother  Coronary Artery Disease (Blocked arteries around heart) Father  High blood pressure (Hypertension) Father  Obesity Brother  High blood pressure (Hypertension) Brother  Coronary Artery Disease (Blocked arteries around heart) Brother  Diabetes Brother    Social History   Tobacco Use  Smoking Status Former  Types: Cigarettes  Smokeless Tobacco Never    Social History   Socioeconomic History  Marital status: Divorced  Tobacco Use  Smoking status: Former  Types: Cigarettes  Smokeless tobacco: Never  Substance and Sexual Activity  Alcohol use: Never  Drug use: Never   Objective:   Vitals:  08/02/22 1000  BP: 110/68  Pulse: 102  Temp: 36.6 C (97.8 F)  SpO2: 96%  Weight: 66.7 kg (147 lb)  Height: 167.6 cm ('5\' 6"'$ )   Body mass index is 23.73 kg/m.  Physical Exam Constitutional:  Appearance: Normal appearance.  HENT:  Head: Normocephalic and atraumatic.  Pulmonary:  Effort: Pulmonary effort is normal.  Abdominal:  Comments: Tenderness in the epigastrium. No hernias.  Musculoskeletal:  General: Normal range of motion.  Cervical back: Normal range  of motion.  Neurological:  General: No focal deficit present.  Mental Status: She is alert and oriented to person, place, and time. Mental status is at baseline.  Psychiatric:  Mood and Affect: Mood normal.  Behavior: Behavior normal.  Thought Content: Thought content normal.    Labs, Imaging and Diagnostic Testing: Abdominal  ultrasound results were reviewed showing multiple large gallstones I reviewed notes by Carson Myrtle, and Adrian Prows Assessment and Plan:  Diagnoses and all orders for this visit:  Calculus of gallbladder with chronic cholecystitis without obstruction  Myotonic dystrophy (CMS-HCC)    [name] [name] is a 66 y.o. female with evidence of gallbladder disease. We discussed the etiology of gallstones and biliary disease and that can cause pain. We discussed exacerbating factors including fatty meals. We discussed the details of surgery for removal of the gallbladder including general anesthesia, 4 small incisions in the patient's abdomen, removal of the patient's gallbladder with the liver and common bile duct, and most likely an outpatient procedure. We discussed risks of common bile duct injury, cystic duct stump leak, injury to liver, bleeding, infection, need for open procedure, and post cholecystectomy syndrome. She showed good understanding and wanted to proceed with laparoscopic cholecystectomy.  I will reach out to her neurology group for discussion of surgery and possible complications of paralytic agents required for laparoscopic surgery. It may be that she will require an observation stay for potential weakness

## 2022-08-18 ENCOUNTER — Encounter (HOSPITAL_COMMUNITY): Payer: Self-pay

## 2022-08-18 ENCOUNTER — Emergency Department (HOSPITAL_COMMUNITY)
Admission: EM | Admit: 2022-08-18 | Discharge: 2022-08-18 | Disposition: A | Discharge status: Home / Self Care | Payer: Medicare Other | Attending: Emergency Medicine | Admitting: Emergency Medicine

## 2022-08-18 ENCOUNTER — Emergency Department (HOSPITAL_COMMUNITY): Payer: Medicare Other

## 2022-08-18 ENCOUNTER — Other Ambulatory Visit: Payer: Self-pay

## 2022-08-18 DIAGNOSIS — Z634 Disappearance and death of family member: Secondary | ICD-10-CM | POA: Diagnosis not present

## 2022-08-18 DIAGNOSIS — I10 Essential (primary) hypertension: Secondary | ICD-10-CM | POA: Insufficient documentation

## 2022-08-18 DIAGNOSIS — R000 Tachycardia, unspecified: Secondary | ICD-10-CM | POA: Diagnosis not present

## 2022-08-18 DIAGNOSIS — Z7982 Long term (current) use of aspirin: Secondary | ICD-10-CM | POA: Diagnosis not present

## 2022-08-18 DIAGNOSIS — R109 Unspecified abdominal pain: Secondary | ICD-10-CM | POA: Insufficient documentation

## 2022-08-18 DIAGNOSIS — G8918 Other acute postprocedural pain: Secondary | ICD-10-CM | POA: Diagnosis present

## 2022-08-18 DIAGNOSIS — F4321 Adjustment disorder with depressed mood: Secondary | ICD-10-CM

## 2022-08-18 DIAGNOSIS — Z79899 Other long term (current) drug therapy: Secondary | ICD-10-CM | POA: Insufficient documentation

## 2022-08-18 LAB — COMPREHENSIVE METABOLIC PANEL
ALT: 44 U/L (ref 0–44)
AST: 61 U/L — ABNORMAL HIGH (ref 15–41)
Albumin: 3.8 g/dL (ref 3.5–5.0)
Alkaline Phosphatase: 216 U/L — ABNORMAL HIGH (ref 38–126)
Anion gap: 7 (ref 5–15)
BUN: 21 mg/dL (ref 8–23)
CO2: 26 mmol/L (ref 22–32)
Calcium: 9.7 mg/dL (ref 8.9–10.3)
Chloride: 105 mmol/L (ref 98–111)
Creatinine, Ser: 0.51 mg/dL (ref 0.44–1.00)
GFR, Estimated: >60 mL/min (ref >60)
Glucose, Bld: 137 mg/dL — ABNORMAL HIGH (ref 70–99)
Potassium: 3.8 mmol/L (ref 3.5–5.1)
Sodium: 138 mmol/L (ref 135–145)
Total Bilirubin: 0.7 mg/dL (ref 0.3–1.2)
Total Protein: 7 g/dL (ref 6.5–8.1)

## 2022-08-18 LAB — CBC WITH DIFFERENTIAL/PLATELET
Abs Immature Granulocytes: 0.08 10*3/uL — ABNORMAL HIGH (ref 0.00–0.07)
Basophils Absolute: 0 10*3/uL (ref 0.0–0.1)
Basophils Relative: 0 %
Eosinophils Absolute: 0 10*3/uL (ref 0.0–0.5)
Eosinophils Relative: 0 %
HCT: 40.7 % (ref 36.0–46.0)
Hemoglobin: 13 g/dL (ref 12.0–15.0)
Immature Granulocytes: 1 %
Lymphocytes Relative: 11 %
Lymphs Abs: 1.1 10*3/uL (ref 0.7–4.0)
MCH: 28.3 pg (ref 26.0–34.0)
MCHC: 31.9 g/dL (ref 30.0–36.0)
MCV: 88.5 fL (ref 80.0–100.0)
Monocytes Absolute: 0.8 10*3/uL (ref 0.1–1.0)
Monocytes Relative: 8 %
Neutro Abs: 7.9 10*3/uL — ABNORMAL HIGH (ref 1.7–7.7)
Neutrophils Relative %: 80 %
Platelets: 154 10*3/uL (ref 150–400)
RBC: 4.6 MIL/uL (ref 3.87–5.11)
RDW: 15.1 % (ref 11.5–15.5)
WBC: 9.9 10*3/uL (ref 4.0–10.5)
nRBC: 0 % (ref 0.0–0.2)

## 2022-08-18 LAB — SURGICAL PATHOLOGY

## 2022-08-18 LAB — LIPASE, BLOOD: Lipase: 26 U/L (ref 11–51)

## 2022-08-18 MED ORDER — LORAZEPAM 1 MG PO TABS: 1.0000 mg | ORAL_TABLET | Freq: Three times a day (TID) | ORAL | 0 refills | Status: AC | PRN

## 2022-08-18 MED ORDER — IOHEXOL 300 MG/ML  SOLN
100.0000 mL | Freq: Once | INTRAMUSCULAR | Status: AC | PRN
  Administered 2022-08-18: 100 mL via INTRAVENOUS

## 2022-08-18 MED ORDER — HYDROMORPHONE HCL 1 MG/ML IJ SOLN
1.0000 mg | Freq: Once | INTRAMUSCULAR | Status: AC
  Administered 2022-08-18: 1 mg via INTRAVENOUS
  Filled 2022-08-18: qty 1

## 2022-08-18 MED ORDER — SODIUM CHLORIDE (PF) 0.9 % IJ SOLN
INTRAMUSCULAR | Status: DC
Start: 2022-08-18 — End: 2022-08-18
  Filled 2022-08-18: qty 50

## 2022-08-18 MED ORDER — LORAZEPAM 1 MG PO TABS
1.0000 mg | ORAL_TABLET | Freq: Once | ORAL | Status: AC
  Administered 2022-08-18: 1 mg via ORAL
  Filled 2022-08-18: qty 1

## 2022-08-18 MED ORDER — MORPHINE SULFATE (PF) 4 MG/ML IV SOLN
4.0000 mg | Freq: Once | INTRAVENOUS | Status: AC
  Administered 2022-08-18: 4 mg via INTRAVENOUS
  Filled 2022-08-18: qty 1

## 2022-08-18 NOTE — Discharge Instructions (Addendum)
Your lab work and CT scan did not reveal any abnormalities.  You are likely experiencing a grief reaction from your terrible loss.  I am sending you with some Ativan for your anxiety and any trouble sleeping.  This is a controlled substance so please keep it out of the reach of others.  Do not drink alcohol with this or drive on it.  You were given Ativan, morphine and Dilaudid today.  You should not take any oxycodone or another dose of Ativan until 9 this morning.  Please call your primary care as soon as they open to let them know what is been going on so that they are able to follow your grief.  It was a pleasure to meet you and we are very sorry for your loss.

## 2022-08-18 NOTE — ED Provider Notes (Signed)
[name] DE[facility] Provider Note   CSN: [id number] Arrival date & time: 08/18/22  0217     History  Chief Complaint  Patient presents with   Post-op Problem   Suicidal    [name] [name] is a 66 y.o. female with a past medical history of hypertension and gallstones for which she had cholecystectomy 9/7 presenting with concern for severe pain.  She was discharged with oxycodone but said that it was not helping her.  She woke up a couple of hours ago and found her son deceased after he traveled to town to help her after her surgery.  This has caused severe distress but she does report that the pain was there prior.  No fevers or chills.  No nausea, vomiting or diarrhea.  Patient is nearly inconsolable throughout history taking  HPI     Home Medications Prior to Admission medications   Medication Sig Start Date End Date Taking? Authorizing Provider  albuterol (VENTOLIN HFA) 108 (90 Base) MCG/ACT inhaler Inhale 2 puffs into the lungs every 4 (four) hours as needed for wheezing.    [provider]  ALPRAZolam Duanne Moron) 0.5 MG tablet Take 0.25-0.5 mg by mouth daily as needed for anxiety.    [provider]  aspirin EC 81 MG tablet Take 81 mg by mouth daily. Swallow whole.    [provider]  benzonatate (TESSALON) 200 MG capsule Take 1 capsule (200 mg total) by mouth 3 (three) times daily as needed for cough. 08/03/22   Tanda Rockers, MD  betamethasone dipropionate 0.05 % cream Apply 1 Application topically daily.    [provider]  calcium-vitamin D 250-100 MG-UNIT per tablet Take 1 tablet by mouth 2 (two) times daily.    [provider]  cefdinir (OMNICEF) 300 MG capsule Take 1 capsule (300 mg total) by mouth 2 (two) times daily. 08/04/22   Tanda Rockers, MD  cyanocobalamin 1000 MCG tablet Take 1,000 mcg by mouth daily.    [provider]  loratadine (CLARITIN) 10 MG tablet Take 10 mg by mouth daily.     [provider]  losartan-hydrochlorothiazide (HYZAAR) 100-12.5 MG tablet Take 1 tablet by mouth daily. 10/05/20   [provider]  Multiple Vitamin (MULTI-VITAMIN) tablet Take 1 tablet by mouth daily.    [provider]  NON FORMULARY Pt uses a cpap nightly    [provider]  omeprazole (PRILOSEC) 20 MG capsule Take 20 mg by mouth daily.    [provider]  oxyCODONE (OXY IR/ROXICODONE) 5 MG immediate release tablet Take 1 tablet (5 mg total) by mouth every 6 (six) hours as needed for severe pain. 08/17/22   Kinsinger, Arta Bruce, MD  sodium chloride (OCEAN) 0.65 % SOLN nasal spray Place 1 spray into both nostrils as needed for congestion.    [provider]  traMADol (ULTRAM) 50 MG tablet Take 100 mg by mouth 2 (two) times daily.    [provider]  zolpidem (AMBIEN) 5 MG tablet Take 5 mg by mouth at bedtime.    [provider]      Allergies    Doxycycline    Review of Systems   Review of Systems  Physical Exam Updated Vital Signs BP (!) 125/59   Pulse (!) 103   Temp 97.9 F (36.6 C) (Oral)   Resp (!) 22   SpO2 98%  Physical Exam Vitals and nursing note reviewed.  Constitutional:      General: She  is in acute distress.     Appearance: Normal appearance.     Comments: Screaming and crying about her son as well as her pain  HENT:     Head: Normocephalic and atraumatic.     Mouth/Throat:     Mouth: Mucous membranes are moist.     Pharynx: Oropharynx is clear.  Eyes:     General: No scleral icterus.    Conjunctiva/sclera: Conjunctivae normal.  Cardiovascular:     Rate and Rhythm: Regular rhythm. Tachycardia present.  Pulmonary:     Effort: Pulmonary effort is normal. No respiratory distress.  Abdominal:     General: Abdomen is flat.     Palpations: Abdomen is soft.     Tenderness: There is abdominal tenderness.     Comments: Generalized tenderness.  Postoperative incision sites are without signs of  infection.  No peritoneal signs on physical exam.  Tenderness seems diffuse.  Skin:    General: Skin is warm and dry.     Findings: No rash.  Neurological:     Mental Status: She is alert.  Psychiatric:        Mood and Affect: Mood normal.     ED Results / Procedures / Treatments   Labs (all labs ordered are listed, but only abnormal results are displayed) Labs Reviewed  CBC WITH DIFFERENTIAL/PLATELET - Abnormal; Notable for the following components:      Result Value   Neutro Abs 7.9 (*)    Abs Immature Granulocytes 0.08 (*)    All other components within normal limits  COMPREHENSIVE METABOLIC PANEL - Abnormal; Notable for the following components:   Glucose, Bld 137 (*)    AST 61 (*)    Alkaline Phosphatase 216 (*)    All other components within normal limits  LIPASE, BLOOD  URINALYSIS, ROUTINE W REFLEX MICROSCOPIC    EKG None  Radiology CT ABDOMEN PELVIS W CONTRAST  Result Date: 08/18/2022 CLINICAL DATA:  Acute, nonlocalized abdominal pain EXAM: CT ABDOMEN AND PELVIS WITH CONTRAST TECHNIQUE: Multidetector CT imaging of the abdomen and pelvis was performed using the standard protocol following bolus administration of intravenous contrast. RADIATION DOSE REDUCTION: This exam was performed according to the departmental dose-optimization program which includes automated exposure control, adjustment of the mA and/or kV according to patient size and/or use of iterative reconstruction technique. CONTRAST:  161m OMNIPAQUE IOHEXOL 300 MG/ML  SOLN COMPARISON:  09/23/2021 FINDINGS: Lower chest: Areas of micro nodularity with tree-in-bud morphology in the right middle lobe and right more than left lower lobes, progressed from before. Cylindrical bronchiectasis partially covered in the right middle lobe. Hepatobiliary: No focal liver abnormality.Cholecystectomy. No bile duct dilatation. Small volume gas and fluid in the gallbladder fossa which is expected after recent surgery. Pancreas:  Unremarkable. Spleen: Chronic splenomegaly with 19 cm craniocaudal span. No focal abnormality. Adrenals/Urinary Tract: Negative adrenals. No hydronephrosis or stone. Sinus cysts on the right. Unremarkable bladder. Stomach/Bowel:  No obstruction or visible injury.  No appendicitis. Vascular/Lymphatic: No acute vascular abnormality. No mass or adenopathy. Reproductive:Hysterectomy Other: Small volume pneumoperitoneum and abdominal wall gas. Both are expected after recent surgery. Musculoskeletal: No acute abnormalities. Generalized lumbar spine degeneration. IMPRESSION: 1. No unexpected finding after recent cholecystectomy. 2. Progression of peripheral airways disease with mild bronchiectasis in the lower lungs when compared to 2020. 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 Recommend pulmonary follow-up. 3. Chronic splenomegaly. Electronically Signed   By: JJorje GuildM.D.   On: 08/18/2022 04:55    Procedures Procedures  Medications Ordered in ED Medications  sodium chloride (PF) 0.9 % injection (has no administration in time range)  LORazepam (ATIVAN) tablet 1 mg (1 mg Oral Given 08/18/22 0253)  morphine (PF) 4 MG/ML injection 4 mg (4 mg Intravenous Given 08/18/22 0305)  HYDROmorphone (DILAUDID) injection 1 mg (1 mg Intravenous Given 08/18/22 0341)  iohexol (OMNIPAQUE) 300 MG/ML solution 100 mL (100 mLs Intravenous Contrast Given 08/18/22 0430)    ED Course/ Medical Decision Making/ A&P Clinical Course as of 08/18/22 0349  Fri Aug 18, 2022  0335 Patient reevaluated after morphine and still says that her pain is intolerable.  Rolling around and screaming in pain.  Dilaudid ordered, patient will receive a CT to rule out intra-abdominal process prior to diagnosing this as a grief response.  She does report Ativan helped her feel a lot calmer [MR]    Clinical Course User Index [MR] Madelena Maturin, Cecilio Asper, PA-C                           Medical Decision Making Amount and/or Complexity of Data  Reviewed Labs: ordered. Radiology: ordered.  Risk Prescription drug management.   This is a 66 year old female with a past medical history of hypertension and cholecystectomy yesterday presenting today due to abdominal pain.  Also reports severe distress surrounding her sons time like death earlier this morning.  Consider expected postoperative pain, postoperative infection, grief.   This is not an exhaustive differential.    Past Medical History / Co-morbidities / Social History: Hypertension and recent cholecystectomy   Additional history: I reviewed patient's notes from her surgery with Dr. Cherlyn Roberts.  There did not appear to be any complications.   Physical Exam: Pertinent physical exam findings include Well-healed incision sites on physical exam.  Generalized tenderness. In apparent emotional distress  Lab Tests: I ordered, and personally interpreted labs.  The pertinent results include: Normal WBC count   Imaging Studies: I ordered and independently visualized and interpreted CT and I agree with the radiologist that there are no acute findings     Medications: I ordered medication including morphine, Ativan and Dilaudid. Reevaluation of the patient after these medicines showed that the patient improved.     MDM/Disposition: This is a 66 year old female less than 24 hours out of outpatient cholecystectomy presenting today with pain.  Lab work is benign.  CT scan without acute findings.  I suspect a lot of her pain to be secondary to grief reaction in the setting of finding her son deceased a few hours ago.  She reported that the Ativan helped her feel calmer.  I will send her with some of this outpatient and instructed her to follow-up with her primary care as soon as possible.  They will be able to treat her longer-term for any situational depression.  This was explained to the patient and her friend at bedside.  They are agreeable to discharge and outpatient follow-up.     I discussed this case with my attending physician Dr. Stark Jock who cosigned this note including patient's presenting symptoms, physical exam, and planned diagnostics and interventions. Attending physician stated agreement with plan or made changes to plan which were implemented.      Final Clinical Impression(s) / ED Diagnoses Final diagnoses:  Post-op pain  Grief at loss of child    Rx / DC Orders ED Discharge Orders          Ordered    LORazepam (ATIVAN) 1 MG tablet  3 times daily PRN        08/18/22 0516          Results and diagnoses were explained to the patient. Return precautions discussed in full. Patient had no additional questions and expressed complete understanding.   This chart was dictated using voice recognition software.  Despite best efforts to proofread,  errors can occur which can change the documentation meaning.     Rhae Hammock, PA-C 08/18/22 6203    Veryl Speak, MD 08/18/22 [id number]

## 2022-08-18 NOTE — ED Notes (Signed)
Pt called out c/o pain. Stated the medication she just received isn't helping at all. PA notified.

## 2022-08-18 NOTE — ED Triage Notes (Addendum)
Patient had her gall bladder removed yesterday. She is having pain at the incision site every time she breathes in. Her son flew in from [place] and this morning the patient found him dead. Patient is having extreme emotional trauma and hyperventilating. She is saying she wants to die right now. She said she cannot live anymore.

## 2022-08-29 NOTE — Discharge Summary (Signed)
Physician Discharge Summary  [name] [name] CHY:[id number] DOB: [birth year] DOA: 08/17/2022  PCP: Michael Boston, MD  Admit date: 08/17/2022 Discharge date: 08/29/2022  Recommendations for Outpatient Follow-up:   (include homehealth, outpatient follow-up instructions, specific recommendations for PCP to follow-up on, etc.)   Follow-up Information     Ledford Goodson, Arta Bruce, MD Follow up in 3 week(s).   Specialty: General Surgery Contact information: [place] [address] [phone]                Discharge Diagnoses:  Principal Problem:   Chronic calculous cholecystitis   Surgical Procedure: lap chole  Discharge Condition: Good Disposition: Home  Diet recommendation: reg diet  Hospital Course:  66 yo female underwent lap chole. 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  Discharge Instructions  Discharge Instructions     Call MD for:  difficulty breathing, headache or visual disturbances   Complete by: As directed    Call MD for:  hives   Complete by: As directed    Call MD for:  persistant nausea and vomiting   Complete by: As directed    Call MD for:  redness, tenderness, or signs of infection (pain, swelling, redness, odor or green/yellow discharge around incision site)   Complete by: As directed    Call MD for:  severe uncontrolled pain   Complete by: As directed    Call MD for:  temperature >100.4   Complete by: As directed    Diet - low sodium heart healthy   Complete by: As directed    Discharge wound care:   Complete by: As directed    Ok to shower tomorrow. Glue will likely peel off in 1-3 weeks. No bandage required   Driving Restrictions   Complete by: As directed    No driving while on narcotics   Increase activity slowly   Complete by: As directed    Lifting restrictions   Complete by: As directed    No lifting greater than 20 pounds for 3 weeks       Allergies as of 08/17/2022       Reactions   Doxycycline Shortness Of Breath        Medication List     TAKE these medications    albuterol 108 (90 Base) MCG/ACT inhaler Commonly known as: VENTOLIN HFA Inhale 2 puffs into the lungs every 4 (four) hours as needed for wheezing.   ALPRAZolam 0.5 MG tablet Commonly known as: XANAX Take 0.25-0.5 mg by mouth daily as needed for anxiety.   aspirin EC 81 MG tablet Take 81 mg by mouth daily. Swallow whole.   benzonatate 200 MG capsule Commonly known as: TESSALON Take 1 capsule (200 mg total) by mouth 3 (three) times daily as needed for cough.   betamethasone dipropionate 0.05 % cream Apply 1 Application topically daily.   calcium-vitamin D 250-100 MG-UNIT tablet Take 1 tablet by mouth 2 (two) times daily.   cefdinir 300 MG capsule Commonly known as: OMNICEF Take 1 capsule (300 mg total) by mouth 2 (two) times daily.   cyanocobalamin 1000 MCG tablet Take 1,000 mcg by mouth daily.   loratadine 10 MG tablet Commonly known as: CLARITIN Take 10 mg by mouth daily.   losartan-hydrochlorothiazide 100-12.5 MG tablet Commonly known as: HYZAAR Take 1 tablet by mouth daily.   Multi-Vitamin tablet Take 1 tablet by mouth daily.   NON FORMULARY  Pt uses a cpap nightly   omeprazole 20 MG capsule Commonly known as: PRILOSEC Take 20 mg by mouth daily.   oxyCODONE 5 MG immediate release tablet Commonly known as: Oxy IR/ROXICODONE Take 1 tablet (5 mg total) by mouth every 6 (six) hours as needed for severe pain.   sodium chloride 0.65 % Soln nasal spray Commonly known as: OCEAN Place 1 spray into both nostrils as needed for congestion.   traMADol 50 MG tablet Commonly known as: ULTRAM Take 100 mg by mouth 2 (two) times daily.   zolpidem 5 MG tablet Commonly known as: AMBIEN Take 5 mg by mouth at bedtime.               Discharge Care Instructions  (From admission, onward)           Start     Ordered    08/17/22 0000  Discharge wound care:       Comments: Ok to shower tomorrow. Glue will likely peel off in 1-3 weeks. No bandage required   08/17/22 1306            Follow-up Information     Elba Schaber, Arta Bruce, MD Follow up in 3 week(s).   Specialty: General Surgery Contact information: [place] [address] [phone]                  The results of significant diagnostics from this hospitalization (including imaging, microbiology, ancillary and laboratory) are listed below for reference.    Significant Diagnostic Studies: CT ABDOMEN PELVIS W CONTRAST  Result Date: 08/18/2022 CLINICAL DATA:  Acute, nonlocalized abdominal pain EXAM: CT ABDOMEN AND PELVIS WITH CONTRAST TECHNIQUE: Multidetector CT imaging of the abdomen and pelvis was performed using the standard protocol following bolus administration of intravenous contrast. RADIATION DOSE REDUCTION: This exam was performed according to the departmental dose-optimization program which includes automated exposure control, adjustment of the mA and/or kV according to patient size and/or use of iterative reconstruction technique. CONTRAST:  11m OMNIPAQUE IOHEXOL 300 MG/ML  SOLN COMPARISON:  09/23/2021 FINDINGS: Lower chest: Areas of micro nodularity with tree-in-bud morphology in the right middle lobe and right more than left lower lobes, progressed from before. Cylindrical bronchiectasis partially covered in the right middle lobe. Hepatobiliary: No focal liver abnormality.Cholecystectomy. No bile duct dilatation. Small volume gas and fluid in the gallbladder fossa which is expected after recent surgery. Pancreas: Unremarkable. Spleen: Chronic splenomegaly with 19 cm craniocaudal span. No focal abnormality. Adrenals/Urinary Tract: Negative adrenals. No hydronephrosis or stone. Sinus cysts on the right. Unremarkable bladder. Stomach/Bowel:  No obstruction or visible injury.  No appendicitis.  Vascular/Lymphatic: No acute vascular abnormality. No mass or adenopathy. Reproductive:Hysterectomy Other: Small volume pneumoperitoneum and abdominal wall gas. Both are expected after recent surgery. Musculoskeletal: No acute abnormalities. Generalized lumbar spine degeneration. IMPRESSION: 1. No unexpected finding after recent cholecystectomy. 2. Progression of peripheral airways disease with mild bronchiectasis in the lower lungs when compared to 2020. 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 Recommend pulmonary follow-up. 3. Chronic splenomegaly. Electronically Signed   By: JJorje GuildM.D.   On: 08/18/2022 04:55    Labs: Basic Metabolic Panel: No results for input(s): "NA", "K", "CL", "CO2", "GLUCOSE", "BUN", "CREATININE", "CALCIUM", "MG", "PHOS" in the last 168 hours. Liver Function Tests: No results for input(s): "AST", "ALT", "ALKPHOS", "BILITOT", "PROT", "ALBUMIN" in the last 168 hours.  CBC: No results for input(s): "WBC", "NEUTROABS", "HGB", "HCT", "MCV", "PLT" in the last 168 hours.  CBG: No results for input(s): "GLUCAP" in the last 168 hours.  Principal Problem:   Chronic calculous cholecystitis   Time coordinating discharge: 15 min

## 2022-09-06 ENCOUNTER — Ambulatory Visit: Payer: Medicare Other | Admitting: Pulmonary Disease

## 2022-12-29 ENCOUNTER — Telehealth: Payer: Self-pay | Admitting: Internal Medicine

## 2023-01-01 NOTE — Telephone Encounter (Signed)
Spoke to patient, she requested PFT results and most recent chest xray to be faxed to Dr. Burton Apley with [facility].  Advised information would get sent today. Nothing further needed

## 2023-01-30 ENCOUNTER — Ambulatory Visit: Payer: Medicare Other | Admitting: Neurology

## 2023-05-07 ENCOUNTER — Ambulatory Visit: Payer: Medicare Other | Admitting: Cardiology
